# Patient Record
Sex: Male | Born: 1960 | Race: White | Hispanic: No | Marital: Married | Smoking: Never smoker
Health system: Southern US, Community
[De-identification: ages and names within clinical notes are randomized; demographics above are authoritative.]

## PROBLEM LIST (undated history)

## (undated) DIAGNOSIS — E7800 Pure hypercholesterolemia, unspecified: Secondary | ICD-10-CM

## (undated) DIAGNOSIS — R9431 Abnormal electrocardiogram [ECG] [EKG]: Secondary | ICD-10-CM

## (undated) DIAGNOSIS — K219 Gastro-esophageal reflux disease without esophagitis: Secondary | ICD-10-CM

## (undated) DIAGNOSIS — J329 Chronic sinusitis, unspecified: Secondary | ICD-10-CM

## (undated) HISTORY — DX: Chronic sinusitis, unspecified: J32.9

## (undated) HISTORY — DX: Abnormal electrocardiogram (ECG) (EKG): R94.31

## (undated) HISTORY — DX: Gastro-esophageal reflux disease without esophagitis: K21.9

## (undated) HISTORY — DX: Pure hypercholesterolemia, unspecified: E78.00

---
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
## 2005-07-27 ENCOUNTER — Ambulatory Visit: Payer: Self-pay | Admitting: Unknown Physician Specialty

## 2010-06-12 ENCOUNTER — Ambulatory Visit: Payer: Self-pay | Admitting: Unknown Physician Specialty

## 2010-06-14 LAB — PATHOLOGY REPORT

## 2012-05-13 ENCOUNTER — Ambulatory Visit (INDEPENDENT_AMBULATORY_CARE_PROVIDER_SITE_OTHER): Payer: BC Managed Care – PPO | Admitting: Internal Medicine

## 2012-05-13 ENCOUNTER — Encounter: Payer: Self-pay | Admitting: Internal Medicine

## 2012-05-13 VITALS — BP 116/80 | HR 84 | Temp 98.5°F | Ht 72.0 in | Wt 180.0 lb

## 2012-05-13 DIAGNOSIS — E7800 Pure hypercholesterolemia, unspecified: Secondary | ICD-10-CM

## 2012-05-13 DIAGNOSIS — R7989 Other specified abnormal findings of blood chemistry: Secondary | ICD-10-CM

## 2012-05-13 DIAGNOSIS — Z139 Encounter for screening, unspecified: Secondary | ICD-10-CM

## 2012-05-13 DIAGNOSIS — K219 Gastro-esophageal reflux disease without esophagitis: Secondary | ICD-10-CM | POA: Insufficient documentation

## 2012-05-13 DIAGNOSIS — E291 Testicular hypofunction: Secondary | ICD-10-CM

## 2012-05-13 DIAGNOSIS — R319 Hematuria, unspecified: Secondary | ICD-10-CM

## 2012-05-13 DIAGNOSIS — R5381 Other malaise: Secondary | ICD-10-CM

## 2012-05-13 DIAGNOSIS — R5383 Other fatigue: Secondary | ICD-10-CM

## 2012-05-13 MED ORDER — OMEPRAZOLE 40 MG PO CPDR: 40.0000 mg | DELAYED_RELEASE_CAPSULE | Freq: Every day | ORAL | Status: DC

## 2012-05-27 ENCOUNTER — Other Ambulatory Visit (INDEPENDENT_AMBULATORY_CARE_PROVIDER_SITE_OTHER): Payer: BC Managed Care – PPO

## 2012-05-27 DIAGNOSIS — R319 Hematuria, unspecified: Secondary | ICD-10-CM

## 2012-05-27 DIAGNOSIS — Z139 Encounter for screening, unspecified: Secondary | ICD-10-CM

## 2012-05-27 DIAGNOSIS — R5383 Other fatigue: Secondary | ICD-10-CM

## 2012-05-27 DIAGNOSIS — R5381 Other malaise: Secondary | ICD-10-CM

## 2012-05-27 LAB — CBC WITH DIFFERENTIAL/PLATELET
Basophils Absolute: 0 10*3/uL (ref 0.0–0.1)
Eosinophils Absolute: 0.2 10*3/uL (ref 0.0–0.7)
HCT: 42.1 % (ref 39.0–52.0)
Lymphs Abs: 2 10*3/uL (ref 0.7–4.0)
Monocytes Absolute: 0.6 10*3/uL (ref 0.1–1.0)
Monocytes Relative: 8.8 % (ref 3.0–12.0)
Platelets: 220 10*3/uL (ref 150.0–400.0)
RDW: 12.9 % (ref 11.5–14.6)

## 2012-05-27 LAB — COMPREHENSIVE METABOLIC PANEL
ALT: 16 U/L (ref 0–53)
AST: 14 U/L (ref 0–37)
Alkaline Phosphatase: 72 U/L (ref 39–117)
CO2: 31 mEq/L (ref 19–32)
Sodium: 138 mEq/L (ref 135–145)
Total Bilirubin: 0.7 mg/dL (ref 0.3–1.2)
Total Protein: 7.4 g/dL (ref 6.0–8.3)

## 2012-05-27 LAB — URINALYSIS, ROUTINE W REFLEX MICROSCOPIC
Bilirubin Urine: NEGATIVE
Leukocytes, UA: NEGATIVE
Nitrite: NEGATIVE
pH: 6 (ref 5.0–8.0)

## 2012-05-27 LAB — LIPID PANEL
HDL: 35 mg/dL — ABNORMAL LOW (ref >39.00)
Total CHOL/HDL Ratio: 5

## 2012-06-05 ENCOUNTER — Encounter: Payer: Self-pay | Admitting: Internal Medicine

## 2012-06-05 NOTE — Assessment & Plan Note (Signed)
Previously receiving testosterone injections.  Declines now secondary to financial reasons.

## 2012-06-05 NOTE — Progress Notes (Signed)
Subjective:    Patient ID: Howard Berry, male    DOB: [birth year], 52 y.o.   MRN: [id number]  HPI 52 year old male with past history of hypercholesterolemia who comes in today to follow up on this as well as for a complete physical exam.  States at Thanksgiving - had a cold.  This resolved.  Reports the first week of 12/13 he woke around 1:00 am and was sweating.  Felt a bear trap was around his chest.  Hurt to walk and move.  A few hours later, had an episode of emesis.  Pain eased some time after.  Took Tums and his omeprazole.  Also took Gaviscon.  Then noticed some pain in his back.  States felt better after he slept for a while, but reports for a few days - he had general body aches.  No pain like he initially experienced.  Has had no problems since.  Stays active.  Reports no chest pain or tightness with increased activity or exertion.  No sob.  Eating and drinking well.  No bowel change.    Past Medical History  Diagnosis Date  . Hypercholesterolemia   . Recurrent sinusitis   . GERD (gastroesophageal reflux disease)     EGD (2007) - erosive duodenitis, reflux esophagitis, multiple gastric polyps  required dilatation.  . Left axis deviation     noted on prior EKG    Current Outpatient Prescriptions on File Prior to Visit  Medication Sig Dispense Refill  . omeprazole (PRILOSEC) 40 MG capsule Take 1 capsule (40 mg total) by mouth daily.  90 capsule  1    Review of Systems Patient denies any headache, lightheadedness or dizziness.  No significant sinus or allergy symptoms now.  No chest pain, tightness or palpitations since the episode outlined above.  No increased shortness of breath, cough or congestion.  No nausea or vomiting.  No acid reflux.  No abdominal pain or cramping.  No bowel change, such as diarrhea, constipation, BRBPR or melana.  No urine change.  Some fatigue, but works a lot.  Overall he feels he is doing well.         Objective:   Physical Exam Filed Vitals:   05/13/12  0925  BP: 116/80  Pulse: 84  Temp: 98.5 F (53.76 C)   52 year old male in no acute distress.  HEENT:  Nares - clear.  Oropharynx - without lesions. NECK:  Supple.  Nontender.  No audible carotid bruit.  HEART:  Appears to be regular.   LUNGS:  No crackles or wheezing audible.  Respirations even and unlabored.   RADIAL PULSE:  Equal bilaterally.  ABDOMEN:  Soft.  Nontender.  Bowel sounds present and normal.  No audible abdominal bruit.  GU:  Normal descended testicles.  No palpable testicular nodules.   RECTAL:  Could not appreciate any palpable prostate nodules.  Heme negative.   EXTREMITIES:  No increased edema present.  DP pulses palpable and equal bilaterally.          Assessment & Plan:  CHEST PAIN.  Unclear as to the exact etiology.  Had a recent stress test - negative for ischemia.  Is active and reports no symptoms with increased activity or exertion.  Declines EKG or further w/up.  Discussed the possibility of a GI origin (ie, gallbladder, etc).  Discussed my desire for further testing.  He declines.  Wants to monitor.  Informed if he has any reoccurrence or problems, he is to be evaluated  immediately.    PREVIOUS LEFT ANTERIOR CERVICAL MASS.  Saw ENT.  Recommended no further w/up.  FATIGUE.  Check cbc, met c and tsh.   HEALTH MAINTENANCE.  Physical today.  Check psa.  Obtain records for review regarding GI evaluation.

## 2012-06-05 NOTE — Assessment & Plan Note (Signed)
Saw Dr Daniel.  Had cystoscopy 10/11 - revealed an irregular area in the bladder.  See previous note.  Have recommended follow up.  He has declined.  Recheck urinalysis with next labs.    

## 2012-06-05 NOTE — Assessment & Plan Note (Signed)
Low cholesterol diet.  Check lipid panel.    

## 2012-06-05 NOTE — Assessment & Plan Note (Signed)
Symptoms controlled on prilosec.  Follow.   

## 2012-09-22 ENCOUNTER — Telehealth: Payer: Self-pay | Admitting: *Deleted

## 2012-09-22 NOTE — Telephone Encounter (Signed)
Started 2 wks ago when driving home from work, starting having pain below rib cage- threw up twice. Took 2 days for pain to go away. Wednesday of last week he became nauseated & sick on the stomach. Went to supper that evening & vomitted. Happened again on Thursday (N&V). Went to [facility] Friday night, did fine until Saturday morning when he threw up again after eating breakfast and drinking Gatorade. Saturday he did fine. Sunday he did fine until that night, pain below rib cage started again. Not sleeping well. Robin offfered appt with Raquel today at 11:30 & with Dr Lorin Picket tomorrow @ 2:45. Please advise (pt in lobby waiting)

## 2012-09-22 NOTE — Telephone Encounter (Signed)
Pt went to Urgent Care & will call back to let us know where he went so we can request records & f/u

## 2012-09-22 NOTE — Telephone Encounter (Signed)
Agree with pt being seen today.  I was unable to see before 2:45.

## 2012-09-26 ENCOUNTER — Ambulatory Visit: Payer: Self-pay | Admitting: Family Medicine

## 2012-09-26 ENCOUNTER — Inpatient Hospital Stay: Payer: Self-pay | Admitting: Surgery

## 2012-09-26 LAB — COMPREHENSIVE METABOLIC PANEL
Albumin: 3.8 g/dL (ref 3.4–5.0)
Alkaline Phosphatase: 79 U/L (ref 50–136)
BUN: 11 mg/dL (ref 7–18)
Bilirubin,Total: 0.6 mg/dL (ref 0.2–1.0)
Calcium, Total: 9.2 mg/dL (ref 8.5–10.1)
Chloride: 103 mmol/L (ref 98–107)
Co2: 29 mmol/L (ref 21–32)
EGFR (African American): >60
Glucose: 79 mg/dL (ref 65–99)
Osmolality: 274 (ref 275–301)
Potassium: 3.8 mmol/L (ref 3.5–5.1)
SGOT(AST): 19 U/L (ref 15–37)
SGPT (ALT): 18 U/L (ref 12–78)
Sodium: 138 mmol/L (ref 136–145)
Total Protein: 8.2 g/dL (ref 6.4–8.2)

## 2012-09-26 LAB — CBC WITH DIFFERENTIAL/PLATELET
Basophil #: 0 10*3/uL (ref 0.0–0.1)
Basophil %: 0.3 %
Eosinophil #: 0.2 10*3/uL (ref 0.0–0.7)
Eosinophil %: 2.4 %
HCT: 39.6 % — ABNORMAL LOW (ref 40.0–52.0)
HGB: 13.7 g/dL (ref 13.0–18.0)
MCH: 30.8 pg (ref 26.0–34.0)
MCV: 89 fL (ref 80–100)
Monocyte #: 0.6 x10 3/mm (ref 0.2–1.0)
Monocyte %: 7.7 %
Neutrophil #: 4.9 10*3/uL (ref 1.4–6.5)
Neutrophil %: 67.8 %
Platelet: 245 10*3/uL (ref 150–440)
WBC: 7.3 10*3/uL (ref 3.8–10.6)

## 2012-10-01 LAB — PATHOLOGY REPORT

## 2012-10-02 HISTORY — PX: CHOLECYSTECTOMY: SHX55

## 2012-11-05 ENCOUNTER — Other Ambulatory Visit: Payer: Self-pay | Admitting: *Deleted

## 2012-11-05 MED ORDER — OMEPRAZOLE 40 MG PO CPDR: 40.0000 mg | DELAYED_RELEASE_CAPSULE | Freq: Every day | ORAL | Status: DC

## 2013-02-23 ENCOUNTER — Encounter: Payer: Self-pay | Admitting: Internal Medicine

## 2013-02-23 ENCOUNTER — Ambulatory Visit (INDEPENDENT_AMBULATORY_CARE_PROVIDER_SITE_OTHER): Payer: BC Managed Care – PPO | Admitting: Internal Medicine

## 2013-02-23 VITALS — BP 100/70 | HR 84 | Temp 98.8°F | Ht 72.0 in | Wt 171.5 lb

## 2013-02-23 DIAGNOSIS — J329 Chronic sinusitis, unspecified: Secondary | ICD-10-CM

## 2013-02-23 MED ORDER — ALBUTEROL SULFATE HFA 108 (90 BASE) MCG/ACT IN AERS: 2.0000 | INHALATION_SPRAY | Freq: Four times a day (QID) | RESPIRATORY_TRACT | Status: DC | PRN

## 2013-02-23 MED ORDER — LEVOFLOXACIN 500 MG PO TABS: 500.0000 mg | ORAL_TABLET | Freq: Every day | ORAL | Status: DC

## 2013-02-23 NOTE — Patient Instructions (Signed)
Saline nasal spray - flush nose at least 2-3x/day.  Nasonex - two sprays each nostril one time per day (do in the evening).   mucinex in the am and Robitussin in the evening.  levaquin - one per day.  I am going to give you an inhaler if needed.

## 2013-02-24 ENCOUNTER — Ambulatory Visit: Payer: BC Managed Care – PPO | Admitting: Adult Health

## 2013-02-28 ENCOUNTER — Encounter: Payer: Self-pay | Admitting: Internal Medicine

## 2013-02-28 DIAGNOSIS — J329 Chronic sinusitis, unspecified: Secondary | ICD-10-CM | POA: Insufficient documentation

## 2013-02-28 NOTE — Progress Notes (Signed)
  Subjective:    Patient ID: Howard Berry, male    DOB: 1961-03-05, 52 y.o.   MRN: [id number]  Wheezing   51 year old male with past history of hypercholesterolemia who comes in today as a work in with concerns regarding increased congestion and cough.  States that symptoms started at the end of 9/14.  Felt better after he went to the beach.  On 02/05/13 he returned from the beach and symptoms returned.  Started having a sinus headache.  Subsequently developed wheezing.  Some increased chest congestion.  Productive cough - thick white and green mucus.  Intermittent drainage.  No sob.  Eating and drinking well.  No bowel change.  Intermittent subjective fever.     Past Medical History  Diagnosis Date  . Hypercholesterolemia   . Recurrent sinusitis   . GERD (gastroesophageal reflux disease)     EGD (2007) - erosive duodenitis, reflux esophagitis, multiple gastric polyps  required dilatation.  . Left axis deviation     noted on prior EKG    Current Outpatient Prescriptions on File Prior to Visit  Medication Sig Dispense Refill  . omeprazole (PRILOSEC) 40 MG capsule Take 1 capsule (40 mg total) by mouth daily.  90 capsule  1   No current facility-administered medications on file prior to visit.    Review of Systems  Respiratory: Positive for wheezing.   Patient denies any lightheadedness or dizziness.  Increased sinus pressure and congestion.  No chest pain, tightness or palpitations.   No increased shortness of breath.  Does report the chest congestion and productive cough of colored mucus.   No nausea or vomiting.  No acid reflux.  No abdominal pain or cramping.  No bowel change, such as diarrhea, constipation, BRBPR or melana.  No urine change.          Objective:   Physical Exam  Filed Vitals:   02/23/13 1109  BP: 100/70  Pulse: 84  Temp: 98.8 F (40.86 C)   52 year old male in no acute distress.  HEENT:  Nares - slightly erythematous turbinates.   Oropharynx - without  lesions. NECK:  Supple.  Nontender.  No audible carotid bruit.  HEART:  Appears to be regular.   LUNGS:  No crackles or wheezing audible.  Respirations even and unlabored.  Some increased cough with forced expiration.   RADIAL PULSE:  Equal bilaterally.           Assessment & Plan:  HEALTH MAINTENANCE.  Physical last visit.

## 2013-02-28 NOTE — Assessment & Plan Note (Signed)
Sinusitis and URI with associated cough and congestion.  Intermittent wheezing.  Will treat with levaquin and albuterol as directed.  Rest.  Fluids.  Explained to him if symptoms changed, worsened or did not resolve - he was to be reevaluated.

## 2013-04-06 ENCOUNTER — Telehealth: Payer: Self-pay | Admitting: Internal Medicine

## 2013-04-06 NOTE — Telephone Encounter (Signed)
Noted  

## 2013-04-06 NOTE — Telephone Encounter (Signed)
Please advise 

## 2013-04-06 NOTE — Telephone Encounter (Signed)
Patient Information:  Caller Name: Johnluke  Phone: [phone]  Patient: Howard Berry, Howard Berry  Gender: Male  DOB: [birth year]  Age: 52 Years  PCP: Dale Duplin  Office Follow Up:  Does the office need to follow up with this patient?: Yes  Instructions For The Office: Patient asking for rx vs office visit but will come in if needed.  Please f/u with patient for preference.   Symptoms  Reason For Call & Symptoms: Seen 3 wks ago, abx completed and seemed better;  Symptoms resumed again 04/04/13, runny nose, watery eyes, cough, wheezing;  Sore throat onset 04/06/13;  Felt like he may have had fever during the night but none this am.  Reviewed Health History In EMR: Yes  Reviewed Medications In EMR: Yes  Reviewed Allergies In EMR: Yes  Reviewed Surgeries / Procedures: Yes  Date of Onset of Symptoms: 04/04/2013  Treatments Tried: Allegra D  Treatments Tried Worked: No  Guideline(s) Used:  Cough  Disposition Per Guideline:   See Within 3 Days in Office  Reason For Disposition Reached:   Allergy symptoms are also present (e.g., itchy eyes, clear nasal discharge, postnasal drip)  Advice Given:  Reassurance  Coughing is the [place] that our lungs remove irritants and mucus. It helps protect our lungs from getting pneumonia.  Here is some care advice that should help.  Cough Medicines:  OTC Cough Drops: Cough drops can help a lot, especially for mild coughs. They reduce coughing by soothing your irritated throat and removing that tickle sensation in the back of the throat. Cough drops also have the advantage of portability - you can carry them with you.  Home Remedy - Hard Candy: Hard candy works just as well as medicine-flavored OTC cough drops. Diabetics should use sugar-free candy.  Home Remedy - Honey: This old home remedy has been shown to help decrease coughing at night. The adult dosage is 2 teaspoons (10 ml) at bedtime. Honey should not be given to infants under one year of age.  OTC Cough  Syrup - Dextromethorphan:  Cough syrups containing the cough suppressant dextromethorphan (DM) may help decrease your cough. Cough syrups work best for coughs that keep you awake at night. They can also sometimes help in the late stages of a respiratory infection when the cough is dry and hacking. They can be used along with cough drops.  Examples: Benylin, Robitussin DM, Vicks 44 Cough Relief  Coughing Spasms:  Drink warm fluids. Inhale warm mist (Reason: both relax the airway and loosen up the phlegm).  Suck on cough drops or hard candy to coat the irritated throat.  Prevent Dehydration:  Drink adequate liquids.  Fever Medicines:  For fevers above 101 F (38.3 C) take either acetaminophen or ibuprofen.  They are over-the-counter (OTC) drugs that help treat both fever and pain. You can buy them at the drugstore.l  Fever Medicines:  For fevers above 101 F (38.3 C) take either acetaminophen or ibuprofen.  They are over-the-counter (OTC) drugs that help treat both fever and pain. You can buy them at the drugstore.l  Acetaminophen (e.g., Tylenol):  Regular Strength Tylenol: Take 650 mg (two 325 mg pills) by mouth every 4-6 hours as needed. Each Regular Strength Tylenol pill has 325 mg of acetaminophen.  Ibuprofen (e.g., Motrin, Advil):  Take 400 mg (two 200 mg pills) by mouth every 6 hours.  Another choice is to take 600 mg (three 200 mg pills) by mouth every 8 hours.  Call Back If:  Difficulty breathing  You become worse.  For a Stuffy Nose - Use Nasal Washes:  Introduction: Saline (salt water) nasal irrigation (nasal wash) is an effective and simple home remedy for treating stuffy nose and sinus congestion. The nose can be irrigated by pouring, spraying, or squirting salt water into the nose and then letting it run back out.  How it Helps: The salt water rinses out excess mucus, washes out any irritants (dust, allergens) that might be present, and moistens the nasal cavity.  Methods: There  are several ways to perform nasal irrigation. You can use a saline nasal spray bottle (available over-the-counter), a rubber ear syringe, a medical syringe without the needle, or a Neti Pot.  Call Back If:  Difficulty breathing occurs  You become worse  Patient Refused Recommendation:  Patient Requests Prescription  Patient will come in if needed, asking if something can be called in though instead?

## 2013-04-06 NOTE — Telephone Encounter (Signed)
I do agree he needs to be seen.  I can see him this pm as a work in.  May have to wait since being worked in.

## 2013-04-06 NOTE — Telephone Encounter (Signed)
Spoke with patient & he is at work now. Scheduled him for an appt with Raquel tomorrow morning

## 2013-04-07 ENCOUNTER — Encounter: Payer: Self-pay | Admitting: Adult Health

## 2013-04-07 ENCOUNTER — Ambulatory Visit (INDEPENDENT_AMBULATORY_CARE_PROVIDER_SITE_OTHER): Payer: BC Managed Care – PPO | Admitting: Adult Health

## 2013-04-07 VITALS — BP 122/80 | HR 99 | Temp 98.6°F | Wt 171.0 lb

## 2013-04-07 DIAGNOSIS — J329 Chronic sinusitis, unspecified: Secondary | ICD-10-CM

## 2013-04-07 MED ORDER — LEVOFLOXACIN 500 MG PO TABS: 500.0000 mg | ORAL_TABLET | Freq: Every day | ORAL | Status: DC

## 2013-04-07 MED ORDER — GUAIFENESIN-CODEINE 100-10 MG/5ML PO SOLN: 5.0000 mL | Freq: Three times a day (TID) | ORAL | Status: DC | PRN

## 2013-04-07 NOTE — Progress Notes (Signed)
   Subjective:    Patient ID: Howard Berry, male    DOB: [birth year], 52 y.o.   MRN: [id number]  HPI  Patient presents with cough, congestion in chest, wheezing. Also with sore throat. Reports fever ~ 101. Patient reports that his mother was recently in the hospital. Most of the family have gotten sick. His symptoms began over the weekend. He has been taking tylenol and mucinex DM.  Current Outpatient Prescriptions on File Prior to Visit  Medication Sig Dispense Refill  . omeprazole (PRILOSEC) 40 MG capsule Take 1 capsule (40 mg total) by mouth daily.  90 capsule  1  . albuterol (PROVENTIL HFA;VENTOLIN HFA) 108 (90 BASE) MCG/ACT inhaler Inhale 2 puffs into the lungs every 6 (six) hours as needed for wheezing.  1 Inhaler  0   No current facility-administered medications on file prior to visit.    Review of Systems  Constitutional: Positive for fever.  HENT: Positive for congestion, postnasal drip, rhinorrhea, sinus pressure and sore throat.   Respiratory: Positive for cough and wheezing.        Objective:   Physical Exam  Constitutional: He is oriented to person, place, and time. He appears well-developed and well-nourished.  HENT:  Head: Normocephalic and atraumatic.  Pharyngeal erythema  Cardiovascular: Normal rate, regular rhythm and normal heart sounds.  Exam reveals no gallop.   No murmur heard. Pulmonary/Chest: Effort normal and breath sounds normal. No respiratory distress. He has no wheezes. He has no rales.  Lymphadenopathy:    He has no cervical adenopathy.  Neurological: He is alert and oriented to person, place, and time.  Skin: Skin is warm and dry.  Psychiatric: He has a normal mood and affect. His behavior is normal. Judgment and thought content normal.          Assessment & Plan:

## 2013-04-07 NOTE — Assessment & Plan Note (Signed)
Start Levaquin 500 mg daily x10 days. Robitussin-AC for severe cough. RTC if symptoms are not improved within 3-4 days.

## 2013-04-07 NOTE — Patient Instructions (Signed)
  Start levaquin 500 mg daily for 10 days.  Robitussin AC for severe cough. This has codeine so it will cause sedation.

## 2013-04-07 NOTE — Progress Notes (Signed)
Pre visit review using our clinic review tool, if applicable. No additional management support is needed unless otherwise documented below in the visit note. 

## 2013-06-03 ENCOUNTER — Other Ambulatory Visit: Payer: Self-pay | Admitting: *Deleted

## 2013-06-03 ENCOUNTER — Encounter: Payer: Self-pay | Admitting: Adult Health

## 2013-06-03 ENCOUNTER — Encounter (INDEPENDENT_AMBULATORY_CARE_PROVIDER_SITE_OTHER): Payer: Self-pay

## 2013-06-03 ENCOUNTER — Ambulatory Visit (INDEPENDENT_AMBULATORY_CARE_PROVIDER_SITE_OTHER): Payer: BC Managed Care – PPO | Admitting: Adult Health

## 2013-06-03 VITALS — BP 106/68 | HR 74 | Temp 98.2°F | Resp 12 | Wt 173.0 lb

## 2013-06-03 DIAGNOSIS — J329 Chronic sinusitis, unspecified: Secondary | ICD-10-CM

## 2013-06-03 MED ORDER — PREDNISONE 10 MG PO TABS: ORAL_TABLET | ORAL | Status: DC

## 2013-06-03 MED ORDER — OMEPRAZOLE 40 MG PO CPDR: 40.0000 mg | DELAYED_RELEASE_CAPSULE | Freq: Every day | ORAL | Status: DC

## 2013-06-03 NOTE — Progress Notes (Signed)
Pre visit review using our clinic review tool, if applicable. No additional management support is needed unless otherwise documented below in the visit note. 

## 2013-06-03 NOTE — Progress Notes (Signed)
   Subjective:    Patient ID: Howard Berry, male    DOB: 01/11/1961, 53 y.o.   MRN: [id number]  HPI  Pt is a 53 y/o male who presents to clinic with sinus issues and lingering cough. Was seen in clinic in December and started on antibiotic as well as cough medication. He reports blowing his nose and seeing considerable amount of blood. He is also reporting that he will experience wheezing with activity. None at present. He has an inhaler however she has not been using it. He also has Flonase nasal spray as well as Nasonex but he has not been using these either. No fever or chills.    Current Outpatient Prescriptions on File Prior to Visit  Medication Sig Dispense Refill  . albuterol (PROVENTIL HFA;VENTOLIN HFA) 108 (90 BASE) MCG/ACT inhaler Inhale 2 puffs into the lungs every 6 (six) hours as needed for wheezing.  1 Inhaler  0  . omeprazole (PRILOSEC) 40 MG capsule Take 1 capsule (40 mg total) by mouth daily.  90 capsule  1   No current facility-administered medications on file prior to visit.     Review of Systems  Constitutional: Negative for fever and chills.  HENT: Positive for sinus pressure.        Has seen blood when blowing his nose. None in the past few days. Some congestion of sinuses.  Respiratory: Positive for cough and wheezing.   All other systems reviewed and are negative.       Objective:   Physical Exam  Constitutional: He is oriented to person, place, and time. He appears well-developed and well-nourished. No distress.  HENT:  Head: Normocephalic and atraumatic.  Mild erythema of pharynx  Pulmonary/Chest: Effort normal and breath sounds normal. No respiratory distress. He has no wheezes. He has no rales.  Musculoskeletal: Normal range of motion.  Lymphadenopathy:    He has no cervical adenopathy.  Neurological: He is alert and oriented to person, place, and time.  Psychiatric: He has a normal mood and affect. His behavior is normal. Judgment and thought content  normal.    BP 106/68  Pulse 74  Temp(Src) 98.2 F (36.8 C) (Oral)  Resp 12  Wt 173 lb (78.472 kg)  SpO2 99%       Assessment & Plan:

## 2013-06-03 NOTE — Patient Instructions (Signed)
  Start the flonase 2 sprays into each nostril daily for your sinuses.  Start prednisone taper as follows:  Day #1 - take 6 tablets Day #2 - take 5 tablets Day #3 - take 4 tablets Day #4 - take 3 tablets Day #5 - take 2 tablets Day #6 - take 1 tablet

## 2013-06-03 NOTE — Assessment & Plan Note (Signed)
Patient is still having some sinus symptoms. No need for antibiotic at present. Will start on prednisone taper. Also instructed to use Flonase 2 sprays into each nostril once a day. Reminded patient to use inhaler with wheezing or shortness of breath. RTC if symptoms are not improved within 3-4 days.

## 2013-07-30 ENCOUNTER — Encounter: Payer: Self-pay | Admitting: Adult Health

## 2013-07-30 ENCOUNTER — Telehealth: Payer: Self-pay | Admitting: Internal Medicine

## 2013-07-30 ENCOUNTER — Ambulatory Visit (INDEPENDENT_AMBULATORY_CARE_PROVIDER_SITE_OTHER): Payer: BC Managed Care – PPO | Admitting: Adult Health

## 2013-07-30 VITALS — BP 104/84 | HR 78 | Temp 98.4°F | Resp 14 | Wt 174.0 lb

## 2013-07-30 DIAGNOSIS — R519 Headache, unspecified: Secondary | ICD-10-CM | POA: Insufficient documentation

## 2013-07-30 DIAGNOSIS — R42 Dizziness and giddiness: Secondary | ICD-10-CM

## 2013-07-30 DIAGNOSIS — R0789 Other chest pain: Secondary | ICD-10-CM | POA: Insufficient documentation

## 2013-07-30 DIAGNOSIS — IMO0001 Reserved for inherently not codable concepts without codable children: Secondary | ICD-10-CM

## 2013-07-30 DIAGNOSIS — R51 Headache: Secondary | ICD-10-CM

## 2013-07-30 DIAGNOSIS — M791 Myalgia, unspecified site: Secondary | ICD-10-CM

## 2013-07-30 LAB — COMPREHENSIVE METABOLIC PANEL
ALT: 18 U/L (ref 0–53)
AST: 17 U/L (ref 0–37)
Albumin: 4.8 g/dL (ref 3.5–5.2)
Alkaline Phosphatase: 76 U/L (ref 39–117)
BUN: 10 mg/dL (ref 6–23)
CALCIUM: 9.6 mg/dL (ref 8.4–10.5)
CHLORIDE: 101 meq/L (ref 96–112)
CO2: 30 mEq/L (ref 19–32)
CREATININE: 1 mg/dL (ref 0.4–1.5)
GFR: 86.96 mL/min (ref >60.00)
Glucose, Bld: 87 mg/dL (ref 70–99)
Potassium: 4.8 mEq/L (ref 3.5–5.1)
Sodium: 138 mEq/L (ref 135–145)
Total Bilirubin: 0.9 mg/dL (ref 0.3–1.2)
Total Protein: 7.7 g/dL (ref 6.0–8.3)

## 2013-07-30 LAB — CBC WITH DIFFERENTIAL/PLATELET
BASOS ABS: 0 10*3/uL (ref 0.0–0.1)
BASOS PCT: 0.7 % (ref 0.0–3.0)
EOS ABS: 0.2 10*3/uL (ref 0.0–0.7)
Eosinophils Relative: 2.3 % (ref 0.0–5.0)
HCT: 43.3 % (ref 39.0–52.0)
HEMOGLOBIN: 14.8 g/dL (ref 13.0–17.0)
LYMPHS ABS: 2 10*3/uL (ref 0.7–4.0)
Lymphocytes Relative: 25.7 % (ref 12.0–46.0)
MCHC: 34 g/dL (ref 30.0–36.0)
MCV: 90.5 fl (ref 78.0–100.0)
Monocytes Absolute: 0.6 10*3/uL (ref 0.1–1.0)
Monocytes Relative: 8.5 % (ref 3.0–12.0)
NEUTROS ABS: 4.8 10*3/uL (ref 1.4–7.7)
Neutrophils Relative %: 62.8 % (ref 43.0–77.0)
Platelets: 238 10*3/uL (ref 150.0–400.0)
RBC: 4.79 Mil/uL (ref 4.22–5.81)
RDW: 12.9 % (ref 11.5–14.6)
WBC: 7.6 10*3/uL (ref 4.5–10.5)

## 2013-07-30 NOTE — Telephone Encounter (Signed)
Put pt on racquel schedule 3/26 11:15

## 2013-07-30 NOTE — Progress Notes (Signed)
Pre visit review using our clinic review tool, if applicable. No additional management support is needed unless otherwise documented below in the visit note. 

## 2013-07-30 NOTE — Telephone Encounter (Signed)
Pt came in today wanting dr scott to know bp jumping around and has slight headache. Had pt fill out traige form and gave to [place] message to talk to pt

## 2013-07-30 NOTE — Progress Notes (Signed)
Patient ID: Howard Berry, male   DOB: July 05, 1960, 53 y.o.   MRN: [id number]    Subjective:    Patient ID: Howard Berry, male    DOB: 1961/01/16, 53 y.o.   MRN: [id number]  HPI  Pt presents to clinic with HA x 2-3 days. He is here to evaluate his b/p as he thinks it may be related. He reports checking his blood pressure at home and it was fine. Does not know the reading. His wife checked it. HA is "faint, comes and goes". He reports that last week he was working outside in the yard and felt lightheaded. Episode was brief. No syncope. He was putting fertilizer out. He was also spraying weeds. He has been lifting heavy bags, racking. When he felt lightheaded he reports it was before lunch time and he does not eat much at breakfast. He does not drink much fluid. He also reports pinpoint area on left side of upper chest that aches with movement of his left arm. Denies pain at rest. Denies sob, jaw pain or arm pain. Patient has not taken any medication for any of his symptoms.   Past Medical History  Diagnosis Date  . Hypercholesterolemia   . Recurrent sinusitis   . GERD (gastroesophageal reflux disease)     EGD (2007) - erosive duodenitis, reflux esophagitis, multiple gastric polyps  required dilatation.  . Left axis deviation     noted on prior EKG    Current Outpatient Prescriptions on File Prior to Visit  Medication Sig Dispense Refill  . albuterol (PROVENTIL HFA;VENTOLIN HFA) 108 (90 BASE) MCG/ACT inhaler Inhale 2 puffs into the lungs every 6 (six) hours as needed for wheezing.  1 Inhaler  0  . omeprazole (PRILOSEC) 40 MG capsule Take 1 capsule (40 mg total) by mouth daily.  90 capsule  3   No current facility-administered medications on file prior to visit.     Review of Systems  Constitutional: Negative for fatigue.  Respiratory: Negative.   Cardiovascular: Negative.   Musculoskeletal:       Muscular pain left side of chest  Allergic/Immunologic: Negative.   Neurological: Positive for  light-headedness and headaches.  Psychiatric/Behavioral: Negative.   All other systems reviewed and are negative.       Objective:  BP 104/84  Pulse 78  Temp(Src) 98.4 F (36.9 C) (Oral)  Resp 14  Wt 174 lb (78.926 kg)  SpO2 95%   Physical Exam  Constitutional: He is oriented to person, place, and time. He appears well-developed and well-nourished. No distress.  HENT:  Head: Normocephalic and atraumatic.  Eyes: Conjunctivae and EOM are normal. Pupils are equal, round, and reactive to light.  Cardiovascular: Normal rate, regular rhythm, normal heart sounds and intact distal pulses.  Exam reveals no gallop and no friction rub.   No murmur heard. Pulmonary/Chest: Effort normal and breath sounds normal. No respiratory distress. He has no wheezes. He has no rales.  Musculoskeletal: Normal range of motion.  Neurological: He is alert and oriented to person, place, and time.  Skin: Skin is warm and dry.  Psychiatric: He has a normal mood and affect. His behavior is normal. Judgment and thought content normal.      Assessment & Plan:   1. Headache(784.0) Mild headache - comes and goes. Blood pressure is not elevated. He has not taken any medication. Recommend tylenol or advil for HA. Call if symptoms persist or intensify.  2. Lightheaded Suspect multifactorial - hours without any oral intake, dehydrated, b/p  low. Transient. Denies occuring again. I will check electrolytes to see if any abnormality. Will also check cbc   - Comprehensive metabolic panel - CBC with Differential  3. Muscular pain Pinpoint area on left side of upper chest that is tender with movement. Given his reports of lifting heavy bags and working outside in the yard, suspect muscular in nature. ? Costochondritis. There is no discomfort at rest. No pain with activity other than involving expansion of chest wall. Recommend advil for inflammation and pain. This will also help his HA. If no improvement he will need to  be evaluated further.

## 2013-07-31 ENCOUNTER — Telehealth: Payer: Self-pay | Admitting: Internal Medicine

## 2013-07-31 NOTE — Telephone Encounter (Signed)
Relevant patient education assigned to patient using Emmi. ° °

## 2013-09-16 ENCOUNTER — Encounter: Payer: Self-pay | Admitting: Internal Medicine

## 2013-09-16 ENCOUNTER — Ambulatory Visit (INDEPENDENT_AMBULATORY_CARE_PROVIDER_SITE_OTHER): Payer: BC Managed Care – PPO | Admitting: Internal Medicine

## 2013-09-16 VITALS — BP 120/80 | HR 73 | Temp 98.5°F | Ht 72.0 in | Wt 172.8 lb

## 2013-09-16 DIAGNOSIS — E291 Testicular hypofunction: Secondary | ICD-10-CM

## 2013-09-16 DIAGNOSIS — R7989 Other specified abnormal findings of blood chemistry: Secondary | ICD-10-CM

## 2013-09-16 DIAGNOSIS — R51 Headache: Secondary | ICD-10-CM

## 2013-09-16 DIAGNOSIS — R0781 Pleurodynia: Secondary | ICD-10-CM

## 2013-09-16 DIAGNOSIS — R42 Dizziness and giddiness: Secondary | ICD-10-CM

## 2013-09-16 DIAGNOSIS — R079 Chest pain, unspecified: Secondary | ICD-10-CM

## 2013-09-16 DIAGNOSIS — K219 Gastro-esophageal reflux disease without esophagitis: Secondary | ICD-10-CM

## 2013-09-16 DIAGNOSIS — R319 Hematuria, unspecified: Secondary | ICD-10-CM

## 2013-09-16 DIAGNOSIS — E7800 Pure hypercholesterolemia, unspecified: Secondary | ICD-10-CM

## 2013-09-16 NOTE — Progress Notes (Signed)
Pre visit review using our clinic review tool, if applicable. No additional management support is needed unless otherwise documented below in the visit note. 

## 2013-09-22 ENCOUNTER — Other Ambulatory Visit: Payer: Self-pay | Admitting: Internal Medicine

## 2013-09-22 ENCOUNTER — Encounter: Payer: Self-pay | Admitting: Internal Medicine

## 2013-09-22 ENCOUNTER — Other Ambulatory Visit (INDEPENDENT_AMBULATORY_CARE_PROVIDER_SITE_OTHER): Payer: BC Managed Care – PPO

## 2013-09-22 ENCOUNTER — Ambulatory Visit (INDEPENDENT_AMBULATORY_CARE_PROVIDER_SITE_OTHER)
Admission: RE | Admit: 2013-09-22 | Discharge: 2013-09-22 | Disposition: A | Discharge status: Home / Self Care | Payer: BC Managed Care – PPO | Source: Ambulatory Visit | Attending: Internal Medicine | Admitting: Internal Medicine

## 2013-09-22 DIAGNOSIS — E7800 Pure hypercholesterolemia, unspecified: Secondary | ICD-10-CM

## 2013-09-22 DIAGNOSIS — R079 Chest pain, unspecified: Secondary | ICD-10-CM

## 2013-09-22 DIAGNOSIS — R0781 Pleurodynia: Secondary | ICD-10-CM

## 2013-09-22 LAB — LIPID PANEL
CHOL/HDL RATIO: 5
Cholesterol: 183 mg/dL (ref 0–200)
HDL: 33.6 mg/dL — AB (ref >39.00)
LDL Cholesterol: 89 mg/dL (ref 0–99)
Triglycerides: 301 mg/dL — ABNORMAL HIGH (ref 0.0–149.0)
VLDL: 60.2 mg/dL — ABNORMAL HIGH (ref 0.0–40.0)

## 2013-09-22 LAB — TSH: TSH: 0.53 u[IU]/mL (ref 0.35–4.50)

## 2013-09-22 NOTE — Assessment & Plan Note (Signed)
Low cholesterol diet.  Check lipid panel.    

## 2013-09-22 NOTE — Assessment & Plan Note (Signed)
Saw Dr Quillian Quince.  Had cystoscopy 10/11 - revealed an irregular area in the bladder.  See previous note.  Have recommended follow up.  He has declined.  Recheck urinalysis with next labs.

## 2013-09-22 NOTE — Progress Notes (Signed)
Subjective:    Patient ID: Howard Berry, male    DOB: 1960/06/12, 53 y.o.   MRN: [id number]  HPI 53 year old male with past history of hypercholesterolemia who comes in today to follow up on this as well as for a complete physical exam.   Was recently evaluated by Raquel Rey.  See her note for details.  Headache better.  Energy better.  Still has some localized chest soreness.  Tender to palpation.  No cough or congestion.  Eating and drinking normally.  No acid reflux.  No swallowing difficulty.  Stays active.  Reports no chest pain or tightness with increased activity or exertion.  No sob.  No bowel change.  No known injury or trauma.      Past Medical History  Diagnosis Date  . Hypercholesterolemia   . Recurrent sinusitis   . GERD (gastroesophageal reflux disease)     EGD (2007) - erosive duodenitis, reflux esophagitis, multiple gastric polyps  required dilatation.  . Left axis deviation     noted on prior EKG    Current Outpatient Prescriptions on File Prior to Visit  Medication Sig Dispense Refill  . albuterol (PROVENTIL HFA;VENTOLIN HFA) 108 (90 BASE) MCG/ACT inhaler Inhale 2 puffs into the lungs every 6 (six) hours as needed for wheezing.  1 Inhaler  0  . omeprazole (PRILOSEC) 40 MG capsule Take 1 capsule (40 mg total) by mouth daily.  90 capsule  3   No current facility-administered medications on file prior to visit.    Review of Systems Patient denies any headache, lightheadedness or dizziness.  No significant sinus or allergy symptoms now.  Chest pain to palpation as outlined.  No palpitations.  No chest tightness with increased activity or exertion.  No increased shortness of breath, cough or congestion.  No pain with deep breathing.  No nausea or vomiting.  No acid reflux.  No abdominal pain or cramping.  No bowel change, such as diarrhea, constipation, BRBPR or melana.  No urine change.  Overall he feels he is doing relatively well.  Just is concerned that his chest is still  sore.  Stays very active.  No chest pain or tightness with increased activity or exertion.         Objective:   Physical Exam  Filed Vitals:   09/16/13 1023  BP: 120/80  Pulse: 73  Temp: 98.5 F (36.9 C)   Blood pressure recheck:  112/78 (left arm) and 116/80 (right arm).   53 year old male in no acute distress.  HEENT:  Nares - clear.  Oropharynx - without lesions. NECK:  Supple.  Nontender.  No audible carotid bruit.  HEART:  Appears to be regular.   LUNGS:  No crackles or wheezing audible.  Respirations even and unlabored.   CHEST WALL:  Increased tenderness to palpation over the left anterior rib.   RADIAL PULSE:  Equal bilaterally.  ABDOMEN:  Soft.  Nontender.  Bowel sounds present and normal.  No audible abdominal bruit.  GU:  Normal descended testicles.  No palpable testicular nodules.   RECTAL:  Could not appreciate any palpable prostate nodules.  Heme negative.   EXTREMITIES:  No increased edema present.  DP pulses palpable and equal bilaterally.          Assessment & Plan:  CHEST PAIN.  Unclear as to the exact etiology.  Had a recent stress test - negative for ischemia.  Is active and reports no symptoms with increased activity or exertion.  Declines  EKG.  Pain is reproducible on exam.  Appears to be msk in origin.  Will check cxr and left anterior rib xray.  Further w/up pending results.     PREVIOUS LEFT ANTERIOR CERVICAL MASS.  Saw ENT.  Recommended no further w/up.  HEALTH MAINTENANCE.  Physical today.  Check psa.    I spent 25 minutes with the patient and more than 50% of the time was spent in consultation regarding the above.

## 2013-09-22 NOTE — Progress Notes (Signed)
Orders placed for f/u labs.  

## 2013-09-22 NOTE — Assessment & Plan Note (Signed)
Resolved

## 2013-09-22 NOTE — Assessment & Plan Note (Signed)
Symptoms controlled on prilosec.  Follow.   

## 2013-09-22 NOTE — Assessment & Plan Note (Signed)
Previously receiving testosterone injections.  Wanted to hold on injections previously.  Follow.

## 2013-09-22 NOTE — Assessment & Plan Note (Signed)
Reproducible on exam.  Check cxr and left anterior rib xray as outlined.  Further w/up pending.  Tylenol if needed.

## 2013-09-23 ENCOUNTER — Other Ambulatory Visit (INDEPENDENT_AMBULATORY_CARE_PROVIDER_SITE_OTHER): Payer: BC Managed Care – PPO

## 2013-09-23 ENCOUNTER — Encounter: Payer: Self-pay | Admitting: *Deleted

## 2013-09-23 ENCOUNTER — Other Ambulatory Visit: Payer: Self-pay | Admitting: *Deleted

## 2013-09-23 DIAGNOSIS — R319 Hematuria, unspecified: Secondary | ICD-10-CM

## 2013-09-23 LAB — URINALYSIS, ROUTINE W REFLEX MICROSCOPIC
BILIRUBIN URINE: NEGATIVE
Hgb urine dipstick: NEGATIVE
Ketones, ur: NEGATIVE
LEUKOCYTES UA: NEGATIVE
NITRITE: NEGATIVE
PH: 7 (ref 5.0–8.0)
RBC / HPF: NONE SEEN (ref 0–?)
SPECIFIC GRAVITY, URINE: 1.01 (ref 1.000–1.030)
Total Protein, Urine: NEGATIVE
UROBILINOGEN UA: 0.2 (ref 0.0–1.0)
Urine Glucose: NEGATIVE
WBC, UA: NONE SEEN (ref 0–?)

## 2013-09-30 ENCOUNTER — Encounter: Payer: Self-pay | Admitting: *Deleted

## 2013-10-06 ENCOUNTER — Telehealth: Payer: Self-pay | Admitting: *Deleted

## 2013-10-06 NOTE — Telephone Encounter (Signed)
Pt presented to office with lots of questions regarding his diet, low glycemic food options, and Dr. Lupita Dawn diet that he was given after his recent labs. Discussed with pt how the glycemic chart is to be used, and that Dr. Lupita Dawn diet is just a guideline. Pt states he eats at odd hours and only eats one large meal a day and needs a more strict plan and portion control to help him change his diet. Per Dr. Nicki Reaper, notified pt to schedule an appointment to discuss in more detail and possible nutritionist referral, pt verbalized understanding. Pt walked to front desk to schedule an appointment.

## 2013-10-08 ENCOUNTER — Encounter: Payer: Self-pay | Admitting: Adult Health

## 2013-10-08 ENCOUNTER — Ambulatory Visit (INDEPENDENT_AMBULATORY_CARE_PROVIDER_SITE_OTHER): Payer: BC Managed Care – PPO | Admitting: Adult Health

## 2013-10-08 VITALS — BP 106/66 | HR 72 | Resp 16 | Ht 72.0 in | Wt 171.8 lb

## 2013-10-08 DIAGNOSIS — Z713 Dietary counseling and surveillance: Secondary | ICD-10-CM

## 2013-10-08 DIAGNOSIS — E781 Pure hyperglyceridemia: Secondary | ICD-10-CM

## 2013-10-08 NOTE — Progress Notes (Signed)
Patient ID: Howard Berry, male   DOB: 11-29-1960, 53 y.o.   MRN: [id number]   Subjective:    Patient ID: Howard Berry, male    DOB: 1960/10/19, 53 y.o.   MRN: [id number]  HPI 53 yo male hypertriglyceridemia presents today with questions regarding glycemic diet that he received during his previous visit with Dr. Nicki Reaper 2 weeks ago. He describes having difficulty making changes due to his work schedule. He has a dietary log present with him and a list of questions from his wife regarding the recommended eating plan. He reports that he cannot follow this plan "because he is going to starve". He brings a legal pad with 3 pages full of questions, sample meals, what he eats on weekends, weekends when he is at the beach, etc.   Past Medical History  Diagnosis Date  . Hypercholesterolemia   . Recurrent sinusitis   . GERD (gastroesophageal reflux disease)     EGD (2007) - erosive duodenitis, reflux esophagitis, multiple gastric polyps  required dilatation.  . Left axis deviation     noted on prior EKG    Current Outpatient Prescriptions on File Prior to Visit  Medication Sig Dispense Refill  . albuterol (PROVENTIL HFA;VENTOLIN HFA) 108 (90 BASE) MCG/ACT inhaler Inhale 2 puffs into the lungs every 6 (six) hours as needed for wheezing.  1 Inhaler  0  . omeprazole (PRILOSEC) 40 MG capsule Take 1 capsule (40 mg total) by mouth daily.  90 capsule  3   No current facility-administered medications on file prior to visit.     Review of Systems  Constitutional: Negative.   HENT: Negative.   Eyes: Negative.   Respiratory: Negative.   Cardiovascular: Negative.   Gastrointestinal: Negative.   Endocrine: Negative.   Genitourinary: Negative.   Musculoskeletal: Negative.   Skin: Negative.   Allergic/Immunologic: Negative.   Neurological: Negative.   Hematological: Negative.   Psychiatric/Behavioral: Negative.        Objective:  BP 106/66  Pulse 72  Resp 16  Ht 6' (1.829 m)  Wt 171 lb 12 oz  (77.905 kg)  BMI 23.29 kg/m2  SpO2 98%   Physical Exam  Constitutional: He is oriented to person, place, and time. He appears well-developed and well-nourished. No distress.  HENT:  Head: Normocephalic and atraumatic.  Eyes: Conjunctivae and EOM are normal.  Neck: Neck supple.  Cardiovascular: Normal rate and regular rhythm.   Pulmonary/Chest: Effort normal. No respiratory distress.  Musculoskeletal: Normal range of motion.  Neurological: He is alert and oriented to person, place, and time. Coordination normal.  Skin: Skin is warm and dry.  Psychiatric: He has a normal mood and affect. His behavior is normal. Judgment and thought content normal.      Assessment & Plan:   1. Dietary counseling Spent greater than 45 min in counseling about his elevated triglycerides and about diet to help lower triglycerides. He was provided with sample foods that have low glycemic index during previous visit. He is confused about this. He  eating fried foods and sweets. Says he cannot follow the diet we recommended because he will starve. I spent time giving him detailed examples of foods to eat such as grilled, baked or broiled chicken but each time he had an excuse as to why he could not eat that. In this particular instance he stated he only eats barbeque chicken loaded in barbeque sauce. He does not like many fruits or vegetables. Wants to eat thousand island dressing. No other dressing will  do. After feeling like we were getting no where with the diet counseling I advised that he eat lean cuts of protein (beef, chicken, fish, [place]), no fried foods or concentrated sweets. Advised to start exercising. I explained to him that he did not need to lose weight but he needed to eat healthier choices and start exercising.   2. Hypertriglyceridemia Needs to have this rechecked in approximately 3 months. If no improvement will need to start medication to lower triglycerides.

## 2013-10-08 NOTE — Patient Instructions (Signed)
  Lean cuts of meat, chicken, pork, fish.  Vegetables, salads (no croutons)  Fruits (avoid pineapple, watermelon)  Brown rice, potato and bread in moderation  No fried foods. No concentrated sweets.  No sports drinks with high sugar contents.  Drink water, water, water  No sweet tea  Try un-sweet with stevia

## 2013-10-08 NOTE — Progress Notes (Signed)
Pre visit review using our clinic review tool, if applicable. No additional management support is needed unless otherwise documented below in the visit note. 

## 2013-10-09 ENCOUNTER — Telehealth: Payer: Self-pay | Admitting: Internal Medicine

## 2013-10-09 NOTE — Telephone Encounter (Signed)
Needing a referral for [facility] for his diabetes counseling .

## 2013-10-09 NOTE — Telephone Encounter (Signed)
Pt saw Howard Berry yesterday & walked in again today (see below)

## 2013-10-09 NOTE — Telephone Encounter (Signed)
I do not mind referring him to [facility] if he desires.  He does not have diabetes.  This would be to sit with a nutritionist. And discussed dietary recommendations.  We can make referral.  I am not sure his insurance will cover.  Let me know if he still wants referral.

## 2013-10-14 NOTE — Telephone Encounter (Signed)
Left detailed message for pt to call back & let us know if would like to proceed with [facility] referral.

## 2013-10-16 NOTE — Telephone Encounter (Signed)
Pt states that he has an appt on July 18th with the Nutritionist

## 2013-11-09 ENCOUNTER — Telehealth: Payer: Self-pay | Admitting: Internal Medicine

## 2013-11-09 NOTE — Telephone Encounter (Signed)
Pt came into office to check status of referral to dietician.  States Dr. Nicki Reaper was referring him.  No order in.  Pt states he needs to know if he is being referred or not so he can cancel the appt.  States the appt has been made at [facility] Monday, 7/13.

## 2013-11-09 NOTE — Telephone Encounter (Signed)
I am not sure that I understand this message.  The dietician is located at [facility].  If he has an appt, then he does not need me to place an order for a referral.  If he is questioning if insurance will pay, the [employer] or his insurance will help with this.  Let me know if I need to do something more.  Thanks.

## 2013-11-10 NOTE — Telephone Encounter (Signed)
Left detailed message.   

## 2013-11-10 NOTE — Telephone Encounter (Signed)
Pt called back stating that he needs a referral to [facility]. They told him that they do not have a referral on file for him. Please call patient once referral is made.

## 2013-11-12 NOTE — Telephone Encounter (Signed)
I filled out the [facility] ref sheet, will fax once signed, thanks!!

## 2013-11-16 ENCOUNTER — Ambulatory Visit: Payer: Self-pay | Admitting: Internal Medicine

## 2013-12-04 ENCOUNTER — Telehealth: Payer: Self-pay | Admitting: *Deleted

## 2013-12-04 NOTE — Telephone Encounter (Signed)
Pt called states he was to have a lab appointment on 8.11.15, appointment not seen in system.  However pt states he needs the appointment scheduled at [facility] early in the morning.  Please advise

## 2013-12-05 ENCOUNTER — Ambulatory Visit: Payer: Self-pay | Admitting: Internal Medicine

## 2013-12-05 NOTE — Telephone Encounter (Signed)
Is he saying that he needs the lab appt at [facility]?  If so, I cannot schedule appts at [facility].  I can write an order for lab to be done there that he can come by and pick up or will be mailed to him.  Just let me know.

## 2013-12-07 NOTE — Telephone Encounter (Signed)
Yes pt wants an order for labs to be drawn at [facility].

## 2013-12-09 NOTE — Telephone Encounter (Signed)
rx written and placed on your desk.  Thanks

## 2013-12-09 NOTE — Telephone Encounter (Signed)
Pt to pick up order today, placed up front

## 2013-12-09 NOTE — Telephone Encounter (Signed)
Left message for pt to return my call.

## 2013-12-15 ENCOUNTER — Other Ambulatory Visit: Payer: BC Managed Care – PPO

## 2013-12-17 ENCOUNTER — Other Ambulatory Visit: Payer: Self-pay | Admitting: Internal Medicine

## 2013-12-17 LAB — COMPREHENSIVE METABOLIC PANEL
AST: 19 U/L (ref 15–37)
Albumin: 4.2 g/dL (ref 3.4–5.0)
Alkaline Phosphatase: 84 U/L
Anion Gap: 4 — ABNORMAL LOW (ref 7–16)
BUN: 17 mg/dL (ref 7–18)
Bilirubin,Total: 0.5 mg/dL (ref 0.2–1.0)
CO2: 30 mmol/L (ref 21–32)
Calcium, Total: 8.9 mg/dL (ref 8.5–10.1)
Chloride: 104 mmol/L (ref 98–107)
Creatinine: 1.13 mg/dL (ref 0.60–1.30)
EGFR (Non-African Amer.): >60
GLUCOSE: 91 mg/dL (ref 65–99)
OSMOLALITY: 277 (ref 275–301)
Potassium: 4.3 mmol/L (ref 3.5–5.1)
SGPT (ALT): 30 U/L
Sodium: 138 mmol/L (ref 136–145)
TOTAL PROTEIN: 8.1 g/dL (ref 6.4–8.2)

## 2013-12-17 LAB — LIPID PANEL
CHOLESTEROL: 174 mg/dL (ref 0–200)
HDL: 38 mg/dL — AB (ref 40–60)
Ldl Cholesterol, Calc: 107 mg/dL — ABNORMAL HIGH (ref 0–100)
TRIGLYCERIDES: 147 mg/dL (ref 0–200)
VLDL CHOLESTEROL, CALC: 29 mg/dL (ref 5–40)

## 2013-12-18 ENCOUNTER — Ambulatory Visit: Payer: BC Managed Care – PPO | Admitting: Internal Medicine

## 2013-12-21 ENCOUNTER — Other Ambulatory Visit: Payer: BC Managed Care – PPO

## 2014-02-18 ENCOUNTER — Encounter: Payer: Self-pay | Admitting: Internal Medicine

## 2014-02-18 ENCOUNTER — Ambulatory Visit (INDEPENDENT_AMBULATORY_CARE_PROVIDER_SITE_OTHER): Payer: BC Managed Care – PPO | Admitting: Internal Medicine

## 2014-02-18 VITALS — BP 110/70 | HR 89 | Temp 99.0°F | Ht 72.0 in | Wt 171.8 lb

## 2014-02-18 DIAGNOSIS — E7800 Pure hypercholesterolemia, unspecified: Secondary | ICD-10-CM

## 2014-02-18 DIAGNOSIS — Z125 Encounter for screening for malignant neoplasm of prostate: Secondary | ICD-10-CM

## 2014-02-18 DIAGNOSIS — K219 Gastro-esophageal reflux disease without esophagitis: Secondary | ICD-10-CM

## 2014-02-18 DIAGNOSIS — R319 Hematuria, unspecified: Secondary | ICD-10-CM

## 2014-02-18 NOTE — Progress Notes (Signed)
Pre visit review using our clinic review tool, if applicable. No additional management support is needed unless otherwise documented below in the visit note. 

## 2014-02-28 ENCOUNTER — Encounter: Payer: Self-pay | Admitting: Internal Medicine

## 2014-02-28 NOTE — Progress Notes (Signed)
Subjective:    Patient ID: Howard Berry, male    DOB: [birth year], 53 y.o.   MRN: [id number]  HPI 53 year old male with past history of hypercholesterolemia who comes in today for a scheduled follow up.  No chest pain or tightness.  No sob.  No cough or congestion.  Eating and drinking well.  Has adjusted his diet.  Watching his cholesterol.  No acid reflux.  No swallowing difficulty.  Stays active.  Reports no chest pain or tightness with increased activity or exertion.  No bowel change.       Past Medical History  Diagnosis Date  . Hypercholesterolemia   . Recurrent sinusitis   . GERD (gastroesophageal reflux disease)     EGD (2007) - erosive duodenitis, reflux esophagitis, multiple gastric polyps  required dilatation.  . Left axis deviation     noted on prior EKG    Current Outpatient Prescriptions on File Prior to Visit  Medication Sig Dispense Refill  . albuterol (PROVENTIL HFA;VENTOLIN HFA) 108 (90 BASE) MCG/ACT inhaler Inhale 2 puffs into the lungs every 6 (six) hours as needed for wheezing.  1 Inhaler  0  . omeprazole (PRILOSEC) 40 MG capsule Take 1 capsule (40 mg total) by mouth daily.  90 capsule  3   No current facility-administered medications on file prior to visit.    Review of Systems Patient denies any headache, lightheadedness or dizziness.  No significant sinus or allergy symptoms.  No chest pain.  No palpitations.  No chest tightness with increased activity or exertion.  No increased shortness of breath, cough or congestion.  No nausea or vomiting.  No acid reflux.  No abdominal pain or cramping.  No bowel change, such as diarrhea, constipation, BRBPR or melana.  No urine change.  Overall he feels he is doing relatively well. Has adjusted his diet.  Watching his cholesterol.          Objective:   Physical Exam  Filed Vitals:   02/18/14 1551  BP: 110/70  Pulse: 89  Temp: 99 F (37.2 C)   Blood pressure recheck:  5347/59  53 year old male in no acute distress.   HEENT:  Nares - clear.  Oropharynx - without lesions. NECK:  Supple.  Nontender.  No audible carotid bruit.  HEART:  Appears to be regular.   LUNGS:  No crackles or wheezing audible.  Respirations even and unlabored.   RADIAL PULSE:  Equal bilaterally.  ABDOMEN:  Soft.  Nontender.  Bowel sounds present and normal.  No audible abdominal bruit.  EXTREMITIES:  No increased edema present.  DP pulses palpable and equal bilaterally.          Assessment & Plan:  CHEST PAIN.  Resolved.      PREVIOUS LEFT ANTERIOR CERVICAL MASS.  Saw ENT.  Recommended no further w/up.  HEALTH MAINTENANCE.  Physical 09/16/13.  Check psa.    Problem List Items Addressed This Visit   GERD (gastroesophageal reflux disease) - Primary     Symptoms controlled on prilosec.  Follow.      Hematuria     Saw Dr Quillian Quince.  Had cystoscopy 10/11 - revealed an irregular area in the bladder.  See previous note.  Have recommended follow up.  He has declined.  Urinalysis 09/23/13 - no red blood cells.         Hypercholesterolemia     Low cholesterol diet.  Has adjusted his diet.  Follow lipid panel.

## 2014-02-28 NOTE — Assessment & Plan Note (Signed)
Low cholesterol diet.  Has adjusted his diet.  Follow lipid panel.

## 2014-02-28 NOTE — Assessment & Plan Note (Signed)
Saw Dr Quillian Quince.  Had cystoscopy 10/11 - revealed an irregular area in the bladder.  See previous note.  Have recommended follow up.  He has declined.  Urinalysis 09/23/13 - no red blood cells.

## 2014-02-28 NOTE — Assessment & Plan Note (Signed)
Symptoms controlled on prilosec.  Follow.   

## 2014-03-05 ENCOUNTER — Other Ambulatory Visit (INDEPENDENT_AMBULATORY_CARE_PROVIDER_SITE_OTHER): Payer: BC Managed Care – PPO

## 2014-03-05 DIAGNOSIS — E7800 Pure hypercholesterolemia, unspecified: Secondary | ICD-10-CM

## 2014-03-05 DIAGNOSIS — Z125 Encounter for screening for malignant neoplasm of prostate: Secondary | ICD-10-CM

## 2014-03-05 LAB — COMPREHENSIVE METABOLIC PANEL
ALT: 29 U/L (ref 0–53)
AST: 19 U/L (ref 0–37)
Albumin: 3.8 g/dL (ref 3.5–5.2)
Alkaline Phosphatase: 76 U/L (ref 39–117)
BILIRUBIN TOTAL: 0.9 mg/dL (ref 0.2–1.2)
BUN: 14 mg/dL (ref 6–23)
CO2: 30 mEq/L (ref 19–32)
CREATININE: 1 mg/dL (ref 0.4–1.5)
Calcium: 9.2 mg/dL (ref 8.4–10.5)
Chloride: 102 mEq/L (ref 96–112)
GFR: 84.72 mL/min (ref >60.00)
Glucose, Bld: 85 mg/dL (ref 70–99)
Potassium: 4.4 mEq/L (ref 3.5–5.1)
Sodium: 138 mEq/L (ref 135–145)
Total Protein: 7.3 g/dL (ref 6.0–8.3)

## 2014-03-05 LAB — LIPID PANEL
Cholesterol: 210 mg/dL — ABNORMAL HIGH (ref 0–200)
HDL: 39.3 mg/dL (ref >39.00)
LDL Cholesterol: 147 mg/dL — ABNORMAL HIGH (ref 0–99)
NonHDL: 170.7
Total CHOL/HDL Ratio: 5
Triglycerides: 121 mg/dL (ref 0.0–149.0)
VLDL: 24.2 mg/dL (ref 0.0–40.0)

## 2014-03-05 LAB — PSA: PSA: 0.7 ng/mL (ref 0.10–4.00)

## 2014-03-08 ENCOUNTER — Encounter: Payer: Self-pay | Admitting: *Deleted

## 2014-08-27 NOTE — Op Note (Signed)
PATIENT NAME:  Howard Berry, Howard Berry MR#:  [id number] DATE OF BIRTH:  Jul 27, 1960  DATE OF PROCEDURE:  09/26/2012  DATE OF DICTATION:  09/27/2012  PREOPERATIVE DIAGNOSIS:  Acute calculus cholecystitis.   POSTOPERATIVE DIAGNOSIS:  Acute calculus cholecystitis.   PROCEDURE PERFORMED:  Laparoscopic cholecystectomy.   SURGEON:  Sherri Rad, M.D.   ASSISTANT:  None.   ANESTHESIA:  General endotracheal.   FINDINGS:  As above.  ESTIMATED BLOOD LOSS:  100 mL  DRAINS:  19 mm Blake drain gallbladder fossa.   DESCRIPTION OF PROCEDURE: With informed consent in supine position, general endotracheal anesthesia, sterile clipping of hair, prep and drape of the abdomen, timeout was observed.   Berry 12 mm blunt Hasson trocar was placed through an open technique through an infraumbilical skin incision. Pneumoperitoneum was established. Berry 5 mm bladeless trocar was placed in the epigastric region. Two 5 mm ports were then placed in the right lateral abdomen. The gallbladder was decompressed of thick motor oil bile, approximately 30 mL was grasped along its fundus and elevated towards the right shoulder. Lateral traction was achieved on Hartmann's pouch. There was an inflammatory rind around the hepatoduodenal ligament which was taken down with blunt technique and hook electrocautery through the peritoneal reflections. Cystic artery and cystic duct were critically identified with Berry critical view of safety cholecystectomy. Three clips were placed on the portal side of each structure, one clip on the gallbladder side and the structures were then divided with scissors. The gallbladder was then retrieved off the gallbladder fossa utilizing hook cautery apparatus without difficulty, placed into an Endo Catch device and retrieved. The right upper quadrant was irrigated with Berry total of 2 liters of normal saline during the operation and aspirated dry. Point hemostasis in the gallbladder fossa was achieved with electrocautery and the  application of 10 mL of Surgiflo with thrombin application. Berry 19 mm Blake drain was directed into Morison's pouch. Drain site was secured with nylon suture. Ports were then removed under direct visualization. The infraumbilical fascial defect was closed with multiple interrupted simple and vertical figure-of-eight, #0 Vicryl suture in vertical orientation. Berry total of 20 mL of 0.25% plain Marcaine was infiltrated along all skin and fascial incisions prior to closure. 4-0 Vicryl subcuticular was applied to all skin edges.   Followed by benzoin, Steri-Strips, Telfa and Tegaderm. The patient was then subsequently extubated and taken to the recovery room in stable and satisfactory condition by anesthesia services.   ____________________________ Jeannette How Marina Gravel, MD mab:ce D: 09/27/2012 08:32:51 ET T: 09/27/2012 14:45:09 ET JOB#: [id number]  cc: Elta Guadeloupe Berry. Marina Gravel, MD, <Dictator> Einar Pheasant, MD  [place] MD ELECTRONICALLY SIGNED 09/30/2012 13:09

## 2014-08-27 NOTE — H&P (Signed)
Subjective/Chief Complaint 8 days of epigastric pain, nausea and vomiting   History of Present Illness 54 y/o otherwise healthy white male with about a 8-9 days hx of acute onset epigastric abdominal pain followed by nausea and emesis along with chills,  Poor po intake since last week.  Labs on 5/19 showed leukocytosis.  LFT's normal.  Does not drink ETOH, smokeless tob pos.  no other medical history  eye surgery  bilateral groin hernia repair as toddler.   Primary Physician [facility]   ALLERGIES:  Penicillin: Unknown  HOME MEDICATIONS: Medication Instructions Status  omeprazole 40 mg oral delayed release capsule 1 cap(s) orally once a day Active   Family and Social History:  Family History Non-Contributory   Social History positive  tobacco, positive  tobacco (Current within 1 year), negative ETOH   + Tobacco Current (within 1 year)   Place of Living Home   Review of Systems:  Subjective/Chief Complaint see above   Physical Exam:  GEN no acute distress, thin   HEENT pale conjunctivae, PERRL, hearing intact to voice   NECK supple   RESP normal resp effort  clear BS   CARD regular rate   ABD positive tenderness  no liver/spleen enlargement  soft  positive murphy's sign   LYMPH negative neck, negative axillae   EXTR negative cyanosis/clubbing   SKIN normal to palpation   NEURO cranial nerves intact, negative rigidity, follows commands   PSYCH alert, A+O to time, place, person, good insight   Radiology Results: [place]:    23-May-14 09:00, US Abdomen General Survey  US Abdomen General Survey  REASON FOR EXAM:    complete  RUQ epigastric abd pain  vomiting w nausea  COMMENTS:       PROCEDURE: [place]  - US ABDOMEN GENERAL SURVEY  - Sep 26 2012  9:00AM     RESULT: Comparison: None    Technique: Multiple gray-scale and color-flow Doppler images of the   abdomen are presented for review.    Findings:    Visualized portions of the liver demonstrate normal  echogenicity and   normal contours. The liver is without evidence of a focal hepatic lesion.   There are cholelithiasis. There is no intra- or extrahepatic biliary   ductal dilatation. The common duct measures 3.2 mm in maximal diameter.   There is gallbladder wall thickening measuring 7.93mm. No pericholecystic   fluid. There is a sonographic Murphy's sign.     The visualized portion of the pancreas is normal in echogenicity. The   spleen is unremarkable. Bilateral kidneys are normal in echogenicity and   size. The right kidney measures 10.9 x 5 x 5.2 cm. The left kidney   measures 10.9 x 5.1 x 5.9 cm. There are no renal calculi or   hydronephrosis. The abdominal aorta and IVC are unremarkable.    IMPRESSION:     1. Cholelithiasis with gallbladder wall thickening concerning for acute   cholecystitis. Recommend surgical consultation.  Dictation Site: 1        Verified By: Jennette Banker, M.D., MD  LabUnknown:  PACS Image    Assessment/Admission Diagnosis 54 y/o with acute calculus cholecystitis   Plan admit, check lfts and lipase, start invanz, narcotics, anti-emetics.  plan chole this admission.   Electronic Signatures: Sherri Rad (MD)  (Signed 23-May-14 14:20)  Authored: CHIEF COMPLAINT and HISTORY, ALLERGIES, HOME MEDICATIONS, FAMILY AND SOCIAL HISTORY, REVIEW OF SYSTEMS, PHYSICAL EXAM, Radiology, ASSESSMENT AND PLAN   Last Updated: 23-May-14 14:20 by Marina Gravel  Elta Guadeloupe (MD)

## 2014-09-22 ENCOUNTER — Ambulatory Visit (INDEPENDENT_AMBULATORY_CARE_PROVIDER_SITE_OTHER): Payer: BLUE CROSS/BLUE SHIELD | Admitting: Internal Medicine

## 2014-09-22 ENCOUNTER — Encounter: Payer: Self-pay | Admitting: Internal Medicine

## 2014-09-22 VITALS — BP 100/80 | HR 73 | Temp 97.9°F | Ht 71.5 in | Wt 168.0 lb

## 2014-09-22 DIAGNOSIS — R319 Hematuria, unspecified: Secondary | ICD-10-CM | POA: Diagnosis not present

## 2014-09-22 DIAGNOSIS — E7800 Pure hypercholesterolemia, unspecified: Secondary | ICD-10-CM

## 2014-09-22 DIAGNOSIS — Z0000 Encounter for general adult medical examination without abnormal findings: Secondary | ICD-10-CM

## 2014-09-22 DIAGNOSIS — K219 Gastro-esophageal reflux disease without esophagitis: Secondary | ICD-10-CM

## 2014-09-22 DIAGNOSIS — J3489 Other specified disorders of nose and nasal sinuses: Secondary | ICD-10-CM

## 2014-09-22 LAB — CBC WITH DIFFERENTIAL/PLATELET
BASOS ABS: 0 10*3/uL (ref 0.0–0.1)
Basophils Relative: 0.6 % (ref 0.0–3.0)
EOS ABS: 0.2 10*3/uL (ref 0.0–0.7)
Eosinophils Relative: 2.5 % (ref 0.0–5.0)
HCT: 44.1 % (ref 39.0–52.0)
HEMOGLOBIN: 15 g/dL (ref 13.0–17.0)
LYMPHS PCT: 26.1 % (ref 12.0–46.0)
Lymphs Abs: 1.9 10*3/uL (ref 0.7–4.0)
MCHC: 34 g/dL (ref 30.0–36.0)
MCV: 88.7 fl (ref 78.0–100.0)
Monocytes Absolute: 0.6 10*3/uL (ref 0.1–1.0)
Monocytes Relative: 8.2 % (ref 3.0–12.0)
NEUTROS ABS: 4.5 10*3/uL (ref 1.4–7.7)
NEUTROS PCT: 62.6 % (ref 43.0–77.0)
PLATELETS: 228 10*3/uL (ref 150.0–400.0)
RBC: 4.97 Mil/uL (ref 4.22–5.81)
RDW: 13.2 % (ref 11.5–15.5)
WBC: 7.2 10*3/uL (ref 4.0–10.5)

## 2014-09-22 LAB — COMPREHENSIVE METABOLIC PANEL
ALBUMIN: 4.4 g/dL (ref 3.5–5.2)
ALT: 15 U/L (ref 0–53)
AST: 16 U/L (ref 0–37)
Alkaline Phosphatase: 78 U/L (ref 39–117)
BUN: 12 mg/dL (ref 6–23)
CALCIUM: 9.6 mg/dL (ref 8.4–10.5)
CHLORIDE: 101 meq/L (ref 96–112)
CO2: 31 mEq/L (ref 19–32)
Creatinine, Ser: 0.97 mg/dL (ref 0.40–1.50)
GFR: 85.55 mL/min (ref >60.00)
Glucose, Bld: 91 mg/dL (ref 70–99)
POTASSIUM: 4.4 meq/L (ref 3.5–5.1)
Sodium: 137 mEq/L (ref 135–145)
TOTAL PROTEIN: 7.5 g/dL (ref 6.0–8.3)
Total Bilirubin: 0.5 mg/dL (ref 0.2–1.2)

## 2014-09-22 LAB — LIPID PANEL
CHOL/HDL RATIO: 5
Cholesterol: 185 mg/dL (ref 0–200)
HDL: 35.3 mg/dL — ABNORMAL LOW (ref >39.00)
NONHDL: 149.7
TRIGLYCERIDES: 223 mg/dL — AB (ref 0.0–149.0)
VLDL: 44.6 mg/dL — ABNORMAL HIGH (ref 0.0–40.0)

## 2014-09-22 LAB — LDL CHOLESTEROL, DIRECT: LDL DIRECT: 86 mg/dL

## 2014-09-22 LAB — TSH: TSH: 0.67 u[IU]/mL (ref 0.35–4.50)

## 2014-09-22 MED ORDER — MUPIROCIN 2 % EX OINT: 1.0000 "application " | TOPICAL_OINTMENT | Freq: Two times a day (BID) | CUTANEOUS | Status: DC

## 2014-09-22 NOTE — Progress Notes (Signed)
Patient ID: Howard Berry, male   DOB: 1960-11-27, 54 y.o.   MRN: [id number]   Subjective:    Patient ID: Howard Berry, male    DOB: [birth year], 54 y.o.   MRN: [id number]  HPI  Patient here to follow up on his current medical issues as well as for his physical exam.  Trying to watch his diet.  Has cut back on sugar and carbs.  Stays active.  No cardiac symptoms with increased activity or exertion.  Eats regular meals.  Weight is stable from last two checks.  Bowels stable.  No abdominal pain or cramping.  No urine change.  Reports crusting (persistent) - nose.  Picks at it.     Past Medical History  Diagnosis Date  . Hypercholesterolemia   . Recurrent sinusitis   . GERD (gastroesophageal reflux disease)     EGD (2007) - erosive duodenitis, reflux esophagitis, multiple gastric polyps  required dilatation.  . Left axis deviation     noted on prior EKG    Current Outpatient Prescriptions on File Prior to Visit  Medication Sig Dispense Refill  . omeprazole (PRILOSEC) 40 MG capsule Take 1 capsule (40 mg total) by mouth daily. 90 capsule 3   No current facility-administered medications on file prior to visit.    Review of Systems  Constitutional: Negative for appetite change and unexpected weight change.  HENT: Negative for congestion and sinus pressure.   Eyes: Negative for pain and visual disturbance.  Respiratory: Negative for cough, chest tightness and shortness of breath.   Cardiovascular: Negative for chest pain, palpitations and leg swelling.  Gastrointestinal: Negative for nausea, vomiting, abdominal pain and diarrhea.  Genitourinary: Negative for dysuria and difficulty urinating.  Musculoskeletal: Negative for back pain and joint swelling.  Skin: Negative for color change and rash.  Neurological: Negative for dizziness, light-headedness and headaches.  Hematological: Negative for adenopathy. Does not bruise/bleed easily.  Psychiatric/Behavioral: Negative for dysphoric mood and  agitation.       Objective:    Physical Exam  Constitutional: He is oriented to person, place, and time. He appears well-developed and well-nourished. No distress.  HENT:  Head: Normocephalic and atraumatic.  Nose: Nose normal.  Mouth/Throat: Oropharynx is clear and moist. No oropharyngeal exudate.  Eyes: Conjunctivae are normal. Right eye exhibits no discharge. Left eye exhibits no discharge.  Neck: Neck supple. No thyromegaly present.  Cardiovascular: Normal rate and regular rhythm.   Pulmonary/Chest: Breath sounds normal. No respiratory distress. He has no wheezes.  Abdominal: Soft. Bowel sounds are normal. There is no tenderness.  Genitourinary:  Normal descended testicles.  Could not appreciate any testicular nodules.  Rectal exam - no palpable prostate noduels.  Heme negative.    Musculoskeletal: He exhibits no edema or tenderness.  Lymphadenopathy:    He has no cervical adenopathy.  Neurological: He is alert and oriented to person, place, and time.  Skin: Skin is warm and dry. No rash noted.  Psychiatric: He has a normal mood and affect. His behavior is normal.    BP 100/80 mmHg  Pulse 73  Temp(Src) 97.9 F (36.6 C) (Oral)  Ht 5' 11.5" (1.816 m)  Wt 168 lb (76.204 kg)  BMI 23.11 kg/m2  SpO2 97% Wt Readings from Last 3 Encounters:  09/22/14 168 lb (76.204 kg)  02/18/14 171 lb 12 oz (77.905 kg)  10/08/13 171 lb 12 oz (77.905 kg)     Lab Results  Component Value Date   WBC 7.6 07/30/2013   HGB  14.8 07/30/2013   HCT 43.3 07/30/2013   PLT 238.0 07/30/2013   GLUCOSE 85 03/05/2014   CHOL 210* 03/05/2014   TRIG 121.0 03/05/2014   HDL 39.30 03/05/2014   LDLCALC 147* 03/05/2014   ALT 29 03/05/2014   AST 19 03/05/2014   NA 138 03/05/2014   K 4.4 03/05/2014   CL 102 03/05/2014   CREATININE 1.0 03/05/2014   BUN 14 03/05/2014   CO2 30 03/05/2014   TSH 0.53 09/22/2013   PSA 0.70 03/05/2014       Assessment & Plan:   Problem List Items Addressed This Visit      GERD (gastroesophageal reflux disease) - Primary    On omeprazole.  No upper symptoms.  Watching his diet.        Health care maintenance    Physical 09/22/14.  PSA 03/05/14 - .70.  Had colonoscopy a few years ago.  Obtain results from [place].        Hematuria    Saw Dr Quillian Quince previously.  Had cystoscopy 02/2010 - revealed an irregular area in the bladder.  See previous note.  Had recommended f/u.  He declines and continues to decline.  Discussed again with him today.  Urinalysis 09/23/13 - no red blood cells.  Recheck.  Wants to provide sample at home and bring in. Has cup.        Relevant Orders   Urinalysis, Routine w reflex microscopic   CBC with Differential/Platelet   Hypercholesterolemia    Has adjusted his diet.  Check lipid panel today.  Stays active.        Relevant Orders   Lipid panel   Comprehensive metabolic panel   TSH   Nasal crusting    Persistent.  bactroban ointment as directed.  Notify me if persistent.           Einar Pheasant, MD

## 2014-09-22 NOTE — Assessment & Plan Note (Addendum)
Saw Dr Quillian Quince previously.  Had cystoscopy 02/2010 - revealed an irregular area in the bladder.  See previous note.  Had recommended f/u.  He declines and continues to decline.  Discussed again with him today.  Urinalysis 09/23/13 - no red blood cells.  Recheck.  Wants to provide sample at home and bring in. Has cup.

## 2014-09-22 NOTE — Assessment & Plan Note (Signed)
On omeprazole.  No upper symptoms.  Watching his diet.

## 2014-09-22 NOTE — Assessment & Plan Note (Signed)
Physical 09/22/14.  PSA 03/05/14 - .70.  Had colonoscopy a few years ago.  Obtain results from [place].

## 2014-09-22 NOTE — Assessment & Plan Note (Signed)
Has adjusted his diet.  Check lipid panel today.  Stays active.

## 2014-09-22 NOTE — Assessment & Plan Note (Signed)
Persistent.  bactroban ointment as directed.  Notify me if persistent.

## 2014-09-22 NOTE — Progress Notes (Signed)
Pre visit review using our clinic review tool, if applicable. No additional management support is needed unless otherwise documented below in the visit note. 

## 2014-09-22 NOTE — Addendum Note (Signed)
Addended by: Karlene Einstein D on: 09/22/2014 09:19 AM   Modules accepted: Orders

## 2014-09-27 ENCOUNTER — Encounter: Payer: Self-pay | Admitting: *Deleted

## 2015-07-21 ENCOUNTER — Other Ambulatory Visit: Payer: Self-pay | Admitting: Internal Medicine

## 2015-10-19 ENCOUNTER — Other Ambulatory Visit: Payer: Self-pay | Admitting: Internal Medicine

## 2015-10-27 ENCOUNTER — Telehealth: Payer: Self-pay | Admitting: Internal Medicine

## 2015-10-27 MED ORDER — OMEPRAZOLE 40 MG PO CPDR: 40.0000 mg | DELAYED_RELEASE_CAPSULE | Freq: Every day | ORAL | Status: DC

## 2015-10-27 NOTE — Telephone Encounter (Signed)
Left message on cellphone to return call.  Patient refill has been ordered but he will need to keep appointment to receive further refills.

## 2015-10-27 NOTE — Telephone Encounter (Signed)
Pt is requesting a refill on his omeprazole (PRILOSEC) 40 MG capsule. He made an appointment for a physical on 01/31/16 at 9:30. Please call pt's cell phone.

## 2015-10-27 NOTE — Telephone Encounter (Signed)
Patient has Physical in September. Patient must keep appointment to receive further refills.

## 2016-01-25 ENCOUNTER — Other Ambulatory Visit: Payer: Self-pay | Admitting: Internal Medicine

## 2016-01-31 ENCOUNTER — Ambulatory Visit (INDEPENDENT_AMBULATORY_CARE_PROVIDER_SITE_OTHER): Payer: BLUE CROSS/BLUE SHIELD | Admitting: Internal Medicine

## 2016-01-31 ENCOUNTER — Encounter: Payer: Self-pay | Admitting: Internal Medicine

## 2016-01-31 VITALS — BP 108/60 | HR 68 | Temp 97.8°F | Ht 72.0 in | Wt 171.0 lb

## 2016-01-31 DIAGNOSIS — K219 Gastro-esophageal reflux disease without esophagitis: Secondary | ICD-10-CM

## 2016-01-31 DIAGNOSIS — E7800 Pure hypercholesterolemia, unspecified: Secondary | ICD-10-CM | POA: Diagnosis not present

## 2016-01-31 DIAGNOSIS — Z0000 Encounter for general adult medical examination without abnormal findings: Secondary | ICD-10-CM | POA: Diagnosis not present

## 2016-01-31 DIAGNOSIS — R319 Hematuria, unspecified: Secondary | ICD-10-CM

## 2016-01-31 DIAGNOSIS — Z125 Encounter for screening for malignant neoplasm of prostate: Secondary | ICD-10-CM

## 2016-01-31 LAB — CBC WITH DIFFERENTIAL/PLATELET
BASOS PCT: 0.5 % (ref 0.0–3.0)
Basophils Absolute: 0 10*3/uL (ref 0.0–0.1)
Eosinophils Absolute: 0.4 10*3/uL (ref 0.0–0.7)
Eosinophils Relative: 5.5 % — ABNORMAL HIGH (ref 0.0–5.0)
HCT: 42.5 % (ref 39.0–52.0)
HEMOGLOBIN: 14.5 g/dL (ref 13.0–17.0)
LYMPHS PCT: 26.2 % (ref 12.0–46.0)
Lymphs Abs: 1.9 10*3/uL (ref 0.7–4.0)
MCHC: 34.1 g/dL (ref 30.0–36.0)
MCV: 89.8 fl (ref 78.0–100.0)
MONO ABS: 0.6 10*3/uL (ref 0.1–1.0)
MONOS PCT: 8.1 % (ref 3.0–12.0)
Neutro Abs: 4.4 10*3/uL (ref 1.4–7.7)
Neutrophils Relative %: 59.7 % (ref 43.0–77.0)
Platelets: 230 10*3/uL (ref 150.0–400.0)
RBC: 4.73 Mil/uL (ref 4.22–5.81)
RDW: 13.1 % (ref 11.5–15.5)
WBC: 7.3 10*3/uL (ref 4.0–10.5)

## 2016-01-31 LAB — COMPREHENSIVE METABOLIC PANEL
ALT: 21 U/L (ref 0–53)
AST: 15 U/L (ref 0–37)
Albumin: 4.3 g/dL (ref 3.5–5.2)
Alkaline Phosphatase: 67 U/L (ref 39–117)
BUN: 15 mg/dL (ref 6–23)
CHLORIDE: 102 meq/L (ref 96–112)
CO2: 35 meq/L — AB (ref 19–32)
Calcium: 9.1 mg/dL (ref 8.4–10.5)
Creatinine, Ser: 0.97 mg/dL (ref 0.40–1.50)
GFR: 85.13 mL/min (ref >60.00)
GLUCOSE: 86 mg/dL (ref 70–99)
POTASSIUM: 4.5 meq/L (ref 3.5–5.1)
SODIUM: 141 meq/L (ref 135–145)
Total Bilirubin: 0.5 mg/dL (ref 0.2–1.2)
Total Protein: 7.3 g/dL (ref 6.0–8.3)

## 2016-01-31 LAB — URINALYSIS, ROUTINE W REFLEX MICROSCOPIC
BILIRUBIN URINE: NEGATIVE
KETONES UR: NEGATIVE
Leukocytes, UA: NEGATIVE
Nitrite: NEGATIVE
SPECIFIC GRAVITY, URINE: 1.025 (ref 1.000–1.030)
Total Protein, Urine: NEGATIVE
URINE GLUCOSE: NEGATIVE
Urobilinogen, UA: 0.2 (ref 0.0–1.0)
pH: 5.5 (ref 5.0–8.0)

## 2016-01-31 LAB — LIPID PANEL
CHOL/HDL RATIO: 5
Cholesterol: 168 mg/dL (ref 0–200)
HDL: 37.1 mg/dL — AB (ref >39.00)
LDL Cholesterol: 101 mg/dL — ABNORMAL HIGH (ref 0–99)
NONHDL: 130.69
Triglycerides: 148 mg/dL (ref 0.0–149.0)
VLDL: 29.6 mg/dL (ref 0.0–40.0)

## 2016-01-31 LAB — PSA: PSA: 0.27 ng/mL (ref 0.10–4.00)

## 2016-01-31 LAB — TSH: TSH: 0.65 u[IU]/mL (ref 0.35–4.50)

## 2016-01-31 NOTE — Assessment & Plan Note (Signed)
Controlled on omeprazole.   

## 2016-01-31 NOTE — Assessment & Plan Note (Addendum)
Physical today 01/31/16.  Check psa with labs.  Colonoscopy 2012.  States recommended f/u in 10 years.

## 2016-01-31 NOTE — Assessment & Plan Note (Signed)
Previously worked up by urology.  Had recommended f/u.  He declined.  Check urinalysis.

## 2016-01-31 NOTE — Progress Notes (Signed)
Patient ID: Howard Berry, male   DOB: Apr 20, 1961, 55 y.o.   MRN: [id number]   Subjective:    Patient ID: Howard Berry, male    DOB: [birth year], 55 y.o.   MRN: [id number]  HPI  Patient here for a physical exam.  Is now working first shift.  Just started two weeks ago.  Body is adjusting.  Stays active.  No chest pain.  No sob.  No acid reflux as long as he takes his medication.   No abdominal pain or cramping.  Bowels stable.  Previously had some increased congestion.  Better.    Past Medical History:  Diagnosis Date  . GERD (gastroesophageal reflux disease)    EGD (2007) - erosive duodenitis, reflux esophagitis, multiple gastric polyps  required dilatation.  . Hypercholesterolemia   . Left axis deviation    noted on prior EKG  . Recurrent sinusitis    Past Surgical History:  Procedure Laterality Date  . CHOLECYSTECTOMY  10/02/12   emergency surgery  . [place]  . TONSILLECTOMY  1966   Family History  Problem Relation Age of Onset  . Heart disease Father     CABG  . Throat cancer Father   . Lung cancer Father   . Thyroid disease Mother   . Heart disease Maternal Grandmother   . Heart disease Paternal Grandfather     MI (died age 49)  . Colon cancer Neg Hx   . Prostate cancer Neg Hx    Social History   Social History  . Marital status: Married    Spouse name: N/A  . Number of children: N/A  . Years of education: N/A   Social History Main Topics  . Smoking status: Never Smoker  . Smokeless tobacco: Current User    Types: Chew  . Alcohol use No  . Drug use: No  . Sexual activity: Not Asked   Other Topics Concern  . None   Social History Narrative  . None    Outpatient Encounter Prescriptions as of 01/31/2016  Medication Sig  . omeprazole (PRILOSEC) 40 MG capsule TAKE 1 CAPSULE DAILY (PHYSICAL DUE IN MAY)  . [DISCONTINUED] mupirocin ointment (BACTROBAN) 2 % Place 1 application into the nose 2 (two) times daily.   No facility-administered encounter  medications on file as of 01/31/2016.     Review of Systems  Constitutional: Negative for appetite change and unexpected weight change.  HENT: Positive for congestion. Negative for sinus pressure.   Eyes: Negative for pain and visual disturbance.  Respiratory: Negative for cough, chest tightness and shortness of breath.   Cardiovascular: Negative for chest pain, palpitations and leg swelling.  Gastrointestinal: Negative for abdominal pain, diarrhea, nausea and vomiting.  Genitourinary: Negative for difficulty urinating and dysuria.  Musculoskeletal: Negative for back pain and joint swelling.  Skin: Negative for color change and rash.  Neurological: Negative for dizziness, light-headedness and headaches.  Hematological: Negative for adenopathy. Does not bruise/bleed easily.  Psychiatric/Behavioral: Negative for agitation and dysphoric mood.       Objective:     Blood pressure rechecked by me:  108/76  Physical Exam  Constitutional: He is oriented to person, place, and time. He appears well-developed and well-nourished. No distress.  HENT:  Head: Normocephalic and atraumatic.  Nose: Nose normal.  Mouth/Throat: Oropharynx is clear and moist. No oropharyngeal exudate.  Eyes: Conjunctivae are normal. Right eye exhibits no discharge. Left eye exhibits no discharge.  Neck: Neck supple. No thyromegaly present.  Cardiovascular:  Normal rate and regular rhythm.   Pulmonary/Chest: Breath sounds normal. No respiratory distress. He has no wheezes.  Abdominal: Soft. Bowel sounds are normal. There is no tenderness.  Genitourinary:  Genitourinary Comments: Rectal exam - no palpable prostate nodules. Heme negative.    Musculoskeletal: He exhibits no edema or tenderness.  Lymphadenopathy:    He has no cervical adenopathy.  Neurological: He is alert and oriented to person, place, and time.  Skin: Skin is warm and dry. No rash noted. No erythema.  Psychiatric: He has a normal mood and affect. His  behavior is normal.    BP 108/60   Pulse 68   Temp 97.8 F (36.6 C) (Oral)   Ht 6' (1.829 m)   Wt 171 lb (77.6 kg)   SpO2 97%   BMI 23.19 kg/m  Wt Readings from Last 3 Encounters:  01/31/16 171 lb (77.6 kg)  09/22/14 168 lb (76.2 kg)  02/18/14 171 lb 12 oz (77.9 kg)     Lab Results  Component Value Date   WBC 7.2 09/22/2014   HGB 15.0 09/22/2014   HCT 44.1 09/22/2014   PLT 228.0 09/22/2014   GLUCOSE 91 09/22/2014   CHOL 185 09/22/2014   TRIG 223.0 (H) 09/22/2014   HDL 35.30 (L) 09/22/2014   LDLDIRECT 86.0 09/22/2014   LDLCALC 147 (H) 03/05/2014   ALT 15 09/22/2014   AST 16 09/22/2014   NA 137 09/22/2014   K 4.4 09/22/2014   CL 101 09/22/2014   CREATININE 0.97 09/22/2014   BUN 12 09/22/2014   CO2 31 09/22/2014   TSH 0.67 09/22/2014   PSA 0.70 03/05/2014       Assessment & Plan:   Problem List Items Addressed This Visit    GERD (gastroesophageal reflux disease)    Controlled on omeprazole.        Health care maintenance    Physical today 01/31/16.  Check psa with labs.  Colonoscopy 2012.  States recommended f/u in 10 years.        Hematuria    Previously worked up by urology.  Had recommended f/u.  He declined.  Check urinalysis.        Relevant Orders   Urinalysis, Routine w reflex microscopic (not at [facility])   Hypercholesterolemia    Low cholesterol diet and exercise.  Follow lipid panel.        Relevant Orders   CBC with Differential/Platelet   Comprehensive metabolic panel   TSH   Lipid panel    Other Visit Diagnoses    Prostate cancer screening    -  Primary   Relevant Orders   PSA       Einar Pheasant, MD

## 2016-01-31 NOTE — Assessment & Plan Note (Signed)
Low cholesterol diet and exercise.  Follow lipid panel.   

## 2016-01-31 NOTE — Progress Notes (Signed)
Pre visit review using our clinic review tool, if applicable. No additional management support is needed unless otherwise documented below in the visit note. 

## 2016-02-04 ENCOUNTER — Other Ambulatory Visit: Payer: Self-pay | Admitting: Internal Medicine

## 2016-02-04 DIAGNOSIS — R319 Hematuria, unspecified: Secondary | ICD-10-CM

## 2016-02-06 ENCOUNTER — Telehealth: Payer: Self-pay | Admitting: Internal Medicine

## 2016-02-06 NOTE — Telephone Encounter (Signed)
Pt called returning your call. Thank you!  [phone]

## 2016-03-01 ENCOUNTER — Ambulatory Visit: Payer: Self-pay

## 2016-03-13 ENCOUNTER — Other Ambulatory Visit: Payer: Self-pay | Admitting: Urology

## 2016-03-13 ENCOUNTER — Ambulatory Visit (INDEPENDENT_AMBULATORY_CARE_PROVIDER_SITE_OTHER): Payer: BLUE CROSS/BLUE SHIELD | Admitting: Urology

## 2016-03-13 ENCOUNTER — Encounter: Payer: Self-pay | Admitting: Urology

## 2016-03-13 VITALS — BP 115/78 | HR 76 | Ht 72.0 in | Wt 178.2 lb

## 2016-03-13 DIAGNOSIS — R319 Hematuria, unspecified: Secondary | ICD-10-CM

## 2016-03-13 MED ORDER — LIDOCAINE HCL 2 % EX GEL
1.0000 "application " | Freq: Once | CUTANEOUS | Status: AC
  Administered 2016-03-13: 1 via URETHRAL

## 2016-03-13 MED ORDER — CIPROFLOXACIN HCL 500 MG PO TABS
500.0000 mg | ORAL_TABLET | Freq: Once | ORAL | Status: AC
  Administered 2016-03-13: 500 mg via ORAL

## 2016-03-13 NOTE — Progress Notes (Signed)
03/13/2016 3:38 PM   Howard Berry 08/06/1960 [id number]  Referring provider: Einar Pheasant, MD [id number] [place], [place] [address]  Chief Complaint  Patient presents with  . New Patient (Initial Visit)    hematuria    HPI: 55 year old male who presents today for further evaluation of microscopic hematuria. This was seen on the patient's routine anal physical exam. His urinalysis demonstrated 3-6 rbc's/hpf. The patient states that he has been evaluated for microscopic hematuria twice in the past. Further, he states that both his mother and his sister have also been noted to have microscopic hematuria and negative workups.  The patient denies any progressive voiding symptoms including frequency, urgency, or incontinence. He denies any dysuria or gross hematuria. He has a history of recurrent urinary tract infections. His obstructive voiding symptoms. He has no history of kidney stones and denies any flank pain, fevers, or chills.  The patient was seen by his primary care provider 4 weeks ago and had a PSA drawn which was 0.27. He had a negative rectal exam at the time.  PMH: Past Medical History:  Diagnosis Date  . GERD (gastroesophageal reflux disease)    EGD (2007) - erosive duodenitis, reflux esophagitis, multiple gastric polyps  required dilatation.  . Hypercholesterolemia   . Left axis deviation    noted on prior EKG  . Recurrent sinusitis     Surgical History: Past Surgical History:  Procedure Laterality Date  . CHOLECYSTECTOMY  10/02/12   emergency surgery  . [place]  . TONSILLECTOMY  1966    Home Medications:    Medication List       Accurate as of 03/13/16  3:38 PM. Always use your most recent med list.          omeprazole 40 MG capsule Commonly known as:  PRILOSEC TAKE 1 CAPSULE DAILY (PHYSICAL DUE IN MAY)       Allergies:  Allergies  Allergen Reactions  . Augmentin [Amoxicillin-Pot Clavulanate]   .  Chlor-Trimeton [Chlorpheniramine Maleate]   . Penicillins     Family History: Family History  Problem Relation Age of Onset  . Heart disease Father     CABG  . Throat cancer Father   . Lung cancer Father   . Thyroid disease Mother   . Hematuria Mother   . Heart disease Maternal Grandmother   . Heart disease Paternal Grandfather     MI (died age 69)  . Colon cancer Neg Hx   . Prostate cancer Neg Hx     Social History:  reports that he has never smoked. His smokeless tobacco use includes Chew. He reports that he does not drink alcohol or use drugs.  ROS: UROLOGY Frequent Urination?: No Hard to postpone urination?: No Burning/pain with urination?: No Get up at night to urinate?: No Leakage of urine?: No Urine stream starts and stops?: No Trouble starting stream?: No Do you have to strain to urinate?: No Blood in urine?: Yes Urinary tract infection?: No Sexually transmitted disease?: No Injury to kidneys or bladder?: No Painful intercourse?: No Weak stream?: No Erection problems?: Yes Penile pain?: No  Gastrointestinal Nausea?: No Vomiting?: No Indigestion/heartburn?: No Diarrhea?: No Constipation?: No  Constitutional Fever: No Night sweats?: No Weight loss?: No Fatigue?: No  Skin Skin rash/lesions?: No Itching?: No  Eyes Blurred vision?: No Double vision?: No  Ears/Nose/Throat Sore throat?: No Sinus problems?: Yes  Hematologic/Lymphatic Swollen glands?: No Easy bruising?: No  Cardiovascular Leg swelling?: No Chest pain?:  No  Respiratory Cough?: No Shortness of breath?: No  Endocrine Excessive thirst?: No  Musculoskeletal Back pain?: No Joint pain?: No  Neurological Headaches?: No Dizziness?: No  Psychologic Depression?: No Anxiety?: No  Physical Exam: BP 115/78   Pulse 76   Ht 6' (1.829 m)   Wt 80.8 kg (178 lb 3.2 oz)   BMI 24.17 kg/m   Constitutional:  Alert and oriented, No acute distress. HEENT: Ord AT, moist mucus  membranes.  Trachea midline, no masses. Cardiovascular: No clubbing, cyanosis, or edema. Respiratory: Normal respiratory effort, no increased work of breathing. GI: Abdomen is soft, nontender, nondistended, no abdominal masses GU: No CVA tenderness.  Skin: No rashes, bruises or suspicious lesions. Lymph: No cervical or inguinal adenopathy. Neurologic: Grossly intact, no focal deficits, moving all 4 extremities. Psychiatric: Normal mood and affect.  Laboratory Data: Lab Results  Component Value Date   WBC 7.3 01/31/2016   HGB 14.5 01/31/2016   HCT 42.5 01/31/2016   MCV 89.8 01/31/2016   PLT 230.0 01/31/2016    Lab Results  Component Value Date   CREATININE 0.97 01/31/2016    Lab Results  Component Value Date   PSA 0.27 01/31/2016   PSA 0.70 03/05/2014   PSA 0.29 05/27/2012    No results found for: TESTOSTERONE  No results found for: HGBA1C  Urinalysis    Component Value Date/Time   COLORURINE YELLOW 01/31/2016 [place] 01/31/2016 1007   LABSPEC 1.025 01/31/2016 1007   PHURINE 5.5 01/31/2016 1007   GLUCOSEU NEGATIVE 01/31/2016 1007   HGBUR TRACE-INTACT (A) 01/31/2016 1007   BILIRUBINUR NEGATIVE 01/31/2016 1007   KETONESUR NEGATIVE 01/31/2016 1007   UROBILINOGEN 0.2 01/31/2016 1007   NITRITE NEGATIVE 01/31/2016 1007   LEUKOCYTESUR NEGATIVE 01/31/2016 1007    Pertinent Imaging: I reviewed an ultrasound that was obtained in 2014 for the patient's right upper quadrant pain. This demonstrates normal-appearing kidneys with full parenchyma no hydronephrosis, stones, or renal masses.  Assessment & Plan:  The patient has microscopic hematuria without any other evidence of infection. This is a recurring issue for the patient as he is already been worked at for this twice in the past. I was able to review an ultrasound from 2014, although this is seemingly outdated.  1. Hematuria, unspecified type I discussed the etiology of his [name] scopic hematuria,  suspect this is a benign familial process area down her, I spoke with the patient about this and we discussed the low likelihood of something significant. However, told that I could not be 100% confident that this was nothing. If he is willing to accept this risk that we may be missing a bladder cancer although unlikely, that I would be okay with him declining the workup. However, the patient has opted to proceed with cystoscopic evaluation to ensure no significant prostate or bladder pathology. - Urinalysis, Complete   No Follow-up on file.  Ardis Hughs, [facility] [address], [place], [place] [address] [phone]

## 2016-03-13 NOTE — Progress Notes (Signed)
   03/13/16  CC:  Chief Complaint  Patient presents with  . New Patient (Initial Visit)    hematuria    HPI:  Blood pressure 115/78, pulse 76, height 6' (1.829 m), weight 80.8 kg (178 lb 3.2 oz). NED. A&Ox3.   No respiratory distress   Abd soft, NT, ND Normal phallus with bilateral descended testicles  Cystoscopy Procedure Note  Patient identification was confirmed, informed consent was obtained, and patient was prepped using Betadine solution.  Lidocaine jelly was administered per urethral meatus.    Preoperative abx where received prior to procedure.     Pre-Procedure: - Inspection reveals a normal caliber ureteral meatus.  Procedure: The flexible cystoscope was introduced without difficulty - No urethral strictures/lesions are present. - Enlarged prostate  - Normal bladder neck - Bilateral ureteral orifices identified - Bladder mucosa  reveals no ulcers. There was a very small papillary lesion in the middle aspect of the trigone. This area was biopsied. The biopsy was very small and may be of low yield. There were no other abnormalities within the bladder. - No bladder stones - No trabeculation  Retroflexion shows no intravesical prostatic protrusion.   Post-Procedure: - Patient tolerated the procedure well  Assessment/ Plan:  Given the small area of the trigone of the patient's bladder, I sent this area to pathology to see if there is any [place] to get a diagnosis. It may be too small for any significant evaluation. However, I do think the patient should follow up with me in 6 months for repeat cystoscopy to ensure that there is no other new gross and that the area biopsied does not recur.

## 2016-03-17 LAB — PATHOLOGY REPORT

## 2016-03-21 ENCOUNTER — Telehealth: Payer: Self-pay

## 2016-03-21 NOTE — Telephone Encounter (Signed)
-----   Message from Ardis Hughs, MD sent at 03/20/2016  7:35 PM EST ----- Please let the patient know that the bladder biopsy was nondiagnostic. We will plan to repeat the test again in 6 months. This is been scheduled.

## 2016-03-21 NOTE — Telephone Encounter (Signed)
Attempted to contact the pt, no answer. LMOM to return my  Call.

## 2016-03-22 NOTE — Telephone Encounter (Signed)
Attempted to contact pt, no answer. LMOM 

## 2016-03-23 NOTE — Telephone Encounter (Signed)
Pt notified, no questions or concerns.

## 2016-03-26 ENCOUNTER — Other Ambulatory Visit: Payer: Self-pay | Admitting: Urology

## 2016-04-24 ENCOUNTER — Other Ambulatory Visit: Payer: Self-pay | Admitting: Internal Medicine

## 2016-07-03 ENCOUNTER — Ambulatory Visit (INDEPENDENT_AMBULATORY_CARE_PROVIDER_SITE_OTHER): Payer: BLUE CROSS/BLUE SHIELD

## 2016-07-03 ENCOUNTER — Encounter: Payer: Self-pay | Admitting: Internal Medicine

## 2016-07-03 ENCOUNTER — Ambulatory Visit (INDEPENDENT_AMBULATORY_CARE_PROVIDER_SITE_OTHER): Payer: BLUE CROSS/BLUE SHIELD | Admitting: Internal Medicine

## 2016-07-03 VITALS — BP 118/62 | HR 61 | Temp 98.6°F | Resp 16 | Ht 72.0 in | Wt 180.4 lb

## 2016-07-03 DIAGNOSIS — R079 Chest pain, unspecified: Secondary | ICD-10-CM

## 2016-07-03 DIAGNOSIS — K219 Gastro-esophageal reflux disease without esophagitis: Secondary | ICD-10-CM | POA: Diagnosis not present

## 2016-07-03 DIAGNOSIS — E7800 Pure hypercholesterolemia, unspecified: Secondary | ICD-10-CM | POA: Diagnosis not present

## 2016-07-03 DIAGNOSIS — Z125 Encounter for screening for malignant neoplasm of prostate: Secondary | ICD-10-CM

## 2016-07-03 DIAGNOSIS — R319 Hematuria, unspecified: Secondary | ICD-10-CM | POA: Diagnosis not present

## 2016-07-03 NOTE — Progress Notes (Signed)
Patient ID: Howard Berry, male   DOB: 1960-06-01, 56 y.o.   MRN: [id number]   Subjective:    Patient ID: Howard Berry, male    DOB: 29-Aug-1960, 56 y.o.   MRN: [id number]  HPI  Patient here for a scheduled follow up.  He reports that approximately 10 days ago, his face felt hot.  No headache. Went on to work.  Had a meeting.  States during the meeting he felt similar symptoms.  Had to go to the bathroom.  Had diarrhea.  Felt ok after bowel movement.  A few hours later, had similar sensation.  Felt weak and had some nausea.  Another episode of diarrhea.  Some decreased appetite.  Minimal light headedness.  Lasted a couple of days.  He felt like his blood pressure was elevated.  Feels better today.  He has noticed some chest discomfort.  Some soreness with palpation.  Given symptoms, he states he is concerned about his blood pressure and wants his heart checked.     Past Medical History:  Diagnosis Date  . GERD (gastroesophageal reflux disease)    EGD (2007) - erosive duodenitis, reflux esophagitis, multiple gastric polyps  required dilatation.  . Hypercholesterolemia   . Left axis deviation    noted on prior EKG  . Recurrent sinusitis    Past Surgical History:  Procedure Laterality Date  . CHOLECYSTECTOMY  10/02/12   emergency surgery  . [place]  . TONSILLECTOMY  1966   Family History  Problem Relation Age of Onset  . Heart disease Father     CABG  . Throat cancer Father   . Lung cancer Father   . Thyroid disease Mother   . Hematuria Mother   . Heart disease Maternal Grandmother   . Heart disease Paternal Grandfather     MI (died age 68)  . Colon cancer Neg Hx   . Prostate cancer Neg Hx    Social History   Social History  . Marital status: Married    Spouse name: N/A  . Number of children: N/A  . Years of education: N/A   Social History Main Topics  . Smoking status: Never Smoker  . Smokeless tobacco: Current User    Types: Chew  . Alcohol use No  . Drug  use: No  . Sexual activity: Not Asked   Other Topics Concern  . None   Social History Narrative  . None    Outpatient Encounter Prescriptions as of 07/03/2016  Medication Sig  . omeprazole (PRILOSEC) 40 MG capsule TAKE 1 CAPSULE DAILY (PHYSICAL DUE IN MAY)   No facility-administered encounter medications on file as of 07/03/2016.     Review of Systems  Constitutional: Negative for unexpected weight change.       Some previous decreased appetite.    HENT: Negative for congestion and sinus pressure.   Respiratory: Negative for cough and shortness of breath.   Cardiovascular: Positive for chest pain. Negative for palpitations and leg swelling.  Gastrointestinal: Negative for abdominal pain and vomiting.       Previous diarrhea.  Resolved.    Genitourinary: Negative for difficulty urinating and dysuria.  Musculoskeletal: Negative for back pain and joint swelling.  Skin: Negative for color change and rash.  Neurological: Negative for headaches.       Previous light headedness.    Psychiatric/Behavioral: Negative for agitation and dysphoric mood.       Objective:    Physical Exam  Constitutional: He appears  well-developed and well-nourished. No distress.  HENT:  Nose: Nose normal.  Mouth/Throat: Oropharynx is clear and moist.  Neck: Neck supple. No thyromegaly present.  Cardiovascular: Normal rate and regular rhythm.   Pulmonary/Chest: Effort normal and breath sounds normal. No respiratory distress.  Abdominal: Soft. Bowel sounds are normal. There is no tenderness.  Musculoskeletal: He exhibits no edema or tenderness.  Some tenderness to palpation over the anterior chest.  Lymphadenopathy:    He has no cervical adenopathy.  Skin: No rash noted. No erythema.  Psychiatric: He has a normal mood and affect. His behavior is normal.    BP 118/62 (BP Location: Left Arm, Patient Position: Sitting, Cuff Size: Large)   Pulse 61   Temp 98.6 F (37 C) (Oral)   Resp 16   Ht 6'  (1.829 m)   Wt 180 lb 6.4 oz (81.8 kg)   SpO2 96%   BMI 24.47 kg/m  Wt Readings from Last 3 Encounters:  07/03/16 180 lb 6.4 oz (81.8 kg)  03/13/16 178 lb 3.2 oz (80.8 kg)  01/31/16 171 lb (77.6 kg)     Lab Results  Component Value Date   WBC 7.3 01/31/2016   HGB 14.5 01/31/2016   HCT 42.5 01/31/2016   PLT 230.0 01/31/2016   GLUCOSE 86 01/31/2016   CHOL 168 01/31/2016   TRIG 148.0 01/31/2016   HDL 37.10 (L) 01/31/2016   LDLDIRECT 86.0 09/22/2014   LDLCALC 101 (H) 01/31/2016   ALT 21 01/31/2016   AST 15 01/31/2016   NA 141 01/31/2016   K 4.5 01/31/2016   CL 102 01/31/2016   CREATININE 0.97 01/31/2016   BUN 15 01/31/2016   CO2 35 (H) 01/31/2016   TSH 0.65 01/31/2016   PSA 0.27 01/31/2016       Assessment & Plan:   Problem List Items Addressed This Visit    Chest pain - Primary    Had the episode as outlined.  Felt hot.  Felt blood pressure elevated.  Had some associated nausea and loose stool.  GI symptoms resolved.  Felt light headed.  Some chest pain.  Some reproducible pain on exam.  Cannot completely say this is the pain he has felt.  Given symptoms, EKG - SR with TWI in III and some flattening in v5 and v6.  No acute ischemic changes  Will check cxr.  Also discussed referral to cardiology for further evaluation and question of need for stress test.        Relevant Orders   EKG 12-Lead (Completed)   DG Chest 2 View (Completed)   Ambulatory referral to Cardiology   GERD (gastroesophageal reflux disease)    Controlled on omeprazole.        Hematuria    Saw urology.  S/p biopsy.  Recommended f/u in 6 months.  See note.        Relevant Orders   Comprehensive metabolic panel   Hypercholesterolemia    Low cholesterol diet and exercise.  Follow lipid panel.        Relevant Orders   Lipid panel   CBC with Differential/Platelet   TSH    Other Visit Diagnoses    Prostate cancer screening       Relevant Orders   PSA       Einar Pheasant, MD

## 2016-07-03 NOTE — Progress Notes (Signed)
Pre-visit discussion using our clinic review tool. No additional management support is needed unless otherwise documented below in the visit note.  

## 2016-07-04 ENCOUNTER — Telehealth: Payer: Self-pay

## 2016-07-04 NOTE — Telephone Encounter (Signed)
Pt called back he is ok to be sent for referral to what ever cardiologist  you would like him to see.

## 2016-07-04 NOTE — Telephone Encounter (Signed)
-----   Message from Einar Pheasant, MD sent at 07/04/2016  5:33 AM EST ----- Notify pt that cxr is ok.  Is he agreeable to cardiology referral now?  If so, I will place the order for the referral.

## 2016-07-04 NOTE — Telephone Encounter (Signed)
lmtrc

## 2016-07-09 ENCOUNTER — Encounter: Payer: Self-pay | Admitting: Internal Medicine

## 2016-07-09 NOTE — Assessment & Plan Note (Addendum)
Had the episode as outlined.  Felt hot.  Felt blood pressure elevated.  Had some associated nausea and loose stool.  GI symptoms resolved.  Felt light headed.  Some chest pain.  Some reproducible pain on exam.  Cannot completely say this is the pain he has felt.  Given symptoms, EKG - SR with TWI in III and some flattening in v5 and v6.  No acute ischemic changes  Will check cxr.  Also discussed referral to cardiology for further evaluation and question of need for stress test.

## 2016-07-09 NOTE — Telephone Encounter (Signed)
Order placed for cardiology referral.   

## 2016-07-09 NOTE — Assessment & Plan Note (Signed)
Saw urology.  S/p biopsy.  Recommended f/u in 6 months.  See note.

## 2016-07-09 NOTE — Assessment & Plan Note (Signed)
Low cholesterol diet and exercise.  Follow lipid panel.   

## 2016-07-09 NOTE — Assessment & Plan Note (Signed)
Controlled on omeprazole.   

## 2016-07-10 ENCOUNTER — Encounter: Payer: Self-pay | Admitting: Internal Medicine

## 2016-07-10 ENCOUNTER — Ambulatory Visit (INDEPENDENT_AMBULATORY_CARE_PROVIDER_SITE_OTHER): Payer: BLUE CROSS/BLUE SHIELD | Admitting: Internal Medicine

## 2016-07-10 VITALS — BP 118/72 | HR 87 | Ht 72.0 in | Wt 180.0 lb

## 2016-07-10 DIAGNOSIS — R0789 Other chest pain: Secondary | ICD-10-CM

## 2016-07-10 DIAGNOSIS — R5381 Other malaise: Secondary | ICD-10-CM | POA: Diagnosis not present

## 2016-07-10 DIAGNOSIS — R42 Dizziness and giddiness: Secondary | ICD-10-CM

## 2016-07-10 NOTE — Progress Notes (Signed)
New Outpatient Visit Date: 07/10/2016  Referring Provider: Einar Pheasant, MD [id number] [place], [place] [address]  Chief Complaint: Chest pain and elevated blood pressure  HPI:  [name] is a 56 y.o. year-old male with history of hyperlipidemia and GERD, who has been referred by Dr. Nicki Reaper for evaluation of chest pain. The patient reports several years of intermittent episodes in which she feels as though his blood pressure "shoots up." It should be noted, however, that he has never actually checked his blood pressure during one of the episodes nor does he carry a diagnosis of hypertension. The episode is often associated with a sensation of warmth involving the face. Sensation occurs sporadically, sometimes happening only once every few weeks. It typically lasts for a few minutes before resolving on its own. About 2 weeks ago, he had a similar episode, though it was also accompanied by nausea and lightheadedness. After the episode began to improve, the patient noted a "soreness" along the lateral margin of the left sternum. It was tender to palpation, lasting several days. He continued to have intermittent lightheadedness for the next 3 days before returning back to his baseline.  Today, the patient is asymptomatic. He has not had any other chest pain or shortness of breath at other times. He also denies orthopnea, PND, palpitations, leg edema, and claudication. 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 He was also able to walk for an extended period of time while deer hunting on New Year's Eve without any difficulty. He reports having undergone at least 2 previous stress tests as recently as 4-5 years ago. He believes both were normal. He does not have a history of cardiovascular disease.  --------------------------------------------------------------------------------------------------  Cardiovascular History & Procedures: Cardiovascular  Problems:  Atypical chest pain  Risk Factors:  Hyperlipidemia, male gender, and age greater than 87  Cath/PCI:  None  CV Surgery:  None  EP Procedures and Devices:  None  Non-Invasive Evaluation(s):  Stress test (at least 4-5 years ago): Negative per patient report.  Recent CV Pertinent Labs: Lab Results  Component Value Date   CHOL 168 01/31/2016   CHOL 174 12/17/2013   HDL 37.10 (L) 01/31/2016   HDL 38 (L) 12/17/2013   LDLCALC 101 (H) 01/31/2016   LDLCALC 107 (H) 12/17/2013   LDLDIRECT 86.0 09/22/2014   TRIG 148.0 01/31/2016   TRIG 147 12/17/2013   CHOLHDL 5 01/31/2016   K 4.5 01/31/2016   K 4.3 12/17/2013   BUN 15 01/31/2016   BUN 17 12/17/2013   CREATININE 0.97 01/31/2016   CREATININE 1.13 12/17/2013    --------------------------------------------------------------------------------------------------  Past Medical History:  Diagnosis Date  . GERD (gastroesophageal reflux disease)    EGD (2007) - erosive duodenitis, reflux esophagitis, multiple gastric polyps  required dilatation.  . Hypercholesterolemia   . Left axis deviation    noted on prior EKG  . Recurrent sinusitis     Past Surgical History:  Procedure Laterality Date  . CHOLECYSTECTOMY  10/02/12   emergency surgery  . [place]  . TONSILLECTOMY  1966    Outpatient Encounter Prescriptions as of 07/10/2016  Medication Sig  . omeprazole (PRILOSEC) 40 MG capsule TAKE 1 CAPSULE DAILY (PHYSICAL DUE IN MAY)   No facility-administered encounter medications on file as of 07/10/2016.     Allergies: Augmentin [amoxicillin-pot clavulanate]; Chlor-trimeton [chlorpheniramine maleate]; and Penicillins  Social History   Social History  . Marital status: Married    Spouse  name: N/A  . Number of children: N/A  . Years of education: N/A   Occupational History  . Not on file.   Social History Main Topics  . Smoking status: Never Smoker  . Smokeless tobacco: Current User    Types:  Chew  . Alcohol use No  . Drug use: No  . Sexual activity: Not on file   Other Topics Concern  . Not on file   Social History Narrative  . No narrative on file    Family History  Problem Relation Age of Onset  . Heart disease Father   . Throat cancer Father   . Lung cancer Father   . Thyroid disease Mother   . Hematuria Mother   . Heart disease Maternal Grandmother   . Heart disease Paternal Grandfather     MI (died age 89)  . Colon cancer Neg Hx   . Prostate cancer Neg Hx     Review of Systems: A 12-system review of systems was performed and was negative except as noted in the HPI.  --------------------------------------------------------------------------------------------------  Physical Exam: BP 118/72 (BP Location: Right Arm, Patient Position: Sitting, Cuff Size: Normal)   Pulse 87   Ht 6' (1.829 m)   Wt 180 lb (81.6 kg)   BMI 24.41 kg/m   General:  Well-developed, well-nourished man seated comfortably in the exam room. HEENT: No conjunctival pallor or scleral icterus.  Moist mucous membranes.  OP clear. Neck: Supple without lymphadenopathy, thyromegaly, JVD, or HJR.  No carotid bruit. Lungs: Normal work of breathing.  Clear to auscultation bilaterally without wheezes or crackles. Heart: Regular rate and rhythm without murmurs, rubs, or gallops.  Non-displaced PMI. Abd: Bowel sounds present.  Soft, NT/ND without hepatosplenomegaly Ext: No lower extremity edema.  Radial, PT, and DP pulses are 2+ bilaterally Skin: warm and dry without rash Neuro: CNIII-XII intact.  Strength and fine-touch sensation intact in upper and lower extremities bilaterally. Psych: Normal mood and affect. Chest wall: No anterior chest wall tenderness to palpation or deformity.  EKG:  Normal sinus rhythm with left anterior fascicular block, unchanged from prior tracing on 07/03/16 (I have personally reviewed both tracings).  Lab Results  Component Value Date   WBC 7.3 01/31/2016   HGB  14.5 01/31/2016   HCT 42.5 01/31/2016   MCV 89.8 01/31/2016   PLT 230.0 01/31/2016    Lab Results  Component Value Date   NA 141 01/31/2016   K 4.5 01/31/2016   CL 102 01/31/2016   CO2 35 (H) 01/31/2016   BUN 15 01/31/2016   CREATININE 0.97 01/31/2016   GLUCOSE 86 01/31/2016   ALT 21 01/31/2016    Lab Results  Component Value Date   CHOL 168 01/31/2016   HDL 37.10 (L) 01/31/2016   LDLCALC 101 (H) 01/31/2016   LDLDIRECT 86.0 09/22/2014   TRIG 148.0 01/31/2016   CHOLHDL 5 01/31/2016    --------------------------------------------------------------------------------------------------  ASSESSMENT AND PLAN: Atypical chest pain Quality of patient's chest pain is atypical. His cardiac risk factors include hyperlipidemia, age, and male gender. His father has a history of heart disease, though it is not clear by the history that this represents premature atherosclerotic cardiovascular disease. We have discussed further evaluation options and have agreed to obtain an exercise tolerance test. EKG today is normal with the exception of left anterior fascicular block, which is stable. Patient should continue omeprazole, given his history of GERD. I encouraged him to stop using smokeless tobacco.  Lightheadedness and malaise Patient believes that some  of his episodes may be due to elevated blood pressure. However, he has never actually checked his blood pressure at the time of his symptoms. He is normotensive today. I counseled him that lightheadedness could actually be due to low blood pressure. I encouraged him to use his home blood pressure cuff if he has future episodes to better understand his blood pressure at the time of symptoms. Given that he does not have any palpitations, I do not believe that event monitor or Holter monitor is worthwhile at this time.  Follow-up: Return to clinic in 4-6 weeks.  Nelva Bush, MD 07/11/2016 12:51 PM

## 2016-07-10 NOTE — Patient Instructions (Signed)
Medication Instructions:  Your physician recommends that you continue on your current medications as directed. Please refer to the Current Medication list given to you today.   Labwork: none  Testing/Procedures: Your physician has requested that you have an exercise tolerance test. For further information please visit [email or web address]. Please also follow instruction sheet, as given.    Follow-Up: Your physician recommends that you schedule a follow-up appointment in: [place].   If you need a refill on your cardiac medications before your next appointment, please call your pharmacy.   Exercise Stress Electrocardiogram An exercise stress electrocardiogram is a test to check how blood flows to your heart. It is done to find areas of poor blood flow. You will need to walk on a treadmill for this test. The electrocardiogram will record your heartbeat when you are at rest and when you are exercising. What happens before the procedure?  Do not have drinks with caffeine or foods with caffeine for 24 hours before the test, or as told by your doctor. This includes coffee, tea (even decaf tea), sodas, chocolate, and cocoa.  Follow your doctor's instructions about eating and drinking before the test.  Ask your doctor what medicines you should or should not take before the test. Take your medicines with water unless told by your doctor not to.  If you use an inhaler, bring it with you to the test.  Bring a snack to eat after the test.  Do not  smoke for 4 hours before the test.  Do not put lotions, powders, creams, or oils on your chest before the test.  Wear comfortable shoes and clothing. What happens during the procedure?  You will have patches put on your chest. Small areas of your chest may need to be shaved. Wires will be connected to the patches.  Your heart rate will be watched while you are resting and while you are exercising.  You will walk on the treadmill.  The treadmill will slowly get faster to raise your heart rate.  The test will take about 1-2 hours. What happens after the procedure?  Your heart rate and blood pressure will be watched after the test.  You may return to your normal diet, activities, and medicines or as told by your doctor. This information is not intended to replace advice given to you by your health care provider. Make sure you discuss any questions you have with your health care provider. Document Released: 10/10/2007 Document Revised: 12/21/2015 Document Reviewed: 12/29/2012 Elsevier Interactive Patient Education  2017 [employer].

## 2016-07-11 ENCOUNTER — Telehealth: Payer: Self-pay

## 2016-07-11 ENCOUNTER — Other Ambulatory Visit (INDEPENDENT_AMBULATORY_CARE_PROVIDER_SITE_OTHER): Payer: BLUE CROSS/BLUE SHIELD

## 2016-07-11 DIAGNOSIS — R7989 Other specified abnormal findings of blood chemistry: Secondary | ICD-10-CM

## 2016-07-11 DIAGNOSIS — Z125 Encounter for screening for malignant neoplasm of prostate: Secondary | ICD-10-CM

## 2016-07-11 DIAGNOSIS — R319 Hematuria, unspecified: Secondary | ICD-10-CM

## 2016-07-11 DIAGNOSIS — E7800 Pure hypercholesterolemia, unspecified: Secondary | ICD-10-CM

## 2016-07-11 LAB — COMPREHENSIVE METABOLIC PANEL
ALBUMIN: 4.4 g/dL (ref 3.5–5.2)
ALK PHOS: 63 U/L (ref 39–117)
ALT: 20 U/L (ref 0–53)
AST: 15 U/L (ref 0–37)
BILIRUBIN TOTAL: 0.5 mg/dL (ref 0.2–1.2)
BUN: 15 mg/dL (ref 6–23)
CALCIUM: 9.5 mg/dL (ref 8.4–10.5)
CO2: 32 mEq/L (ref 19–32)
Chloride: 102 mEq/L (ref 96–112)
Creatinine, Ser: 1.03 mg/dL (ref 0.40–1.50)
GFR: 79.3 mL/min (ref >60.00)
Glucose, Bld: 96 mg/dL (ref 70–99)
Potassium: 4.7 mEq/L (ref 3.5–5.1)
Sodium: 140 mEq/L (ref 135–145)
TOTAL PROTEIN: 7.3 g/dL (ref 6.0–8.3)

## 2016-07-11 LAB — CBC WITH DIFFERENTIAL/PLATELET
BASOS ABS: 0.1 10*3/uL (ref 0.0–0.1)
Basophils Relative: 0.9 % (ref 0.0–3.0)
Eosinophils Absolute: 0.2 10*3/uL (ref 0.0–0.7)
Eosinophils Relative: 3.6 % (ref 0.0–5.0)
HEMATOCRIT: 44.2 % (ref 39.0–52.0)
HEMOGLOBIN: 15.1 g/dL (ref 13.0–17.0)
LYMPHS PCT: 30.4 % (ref 12.0–46.0)
Lymphs Abs: 2 10*3/uL (ref 0.7–4.0)
MCHC: 34.2 g/dL (ref 30.0–36.0)
MCV: 90.3 fl (ref 78.0–100.0)
MONOS PCT: 8.9 % (ref 3.0–12.0)
Monocytes Absolute: 0.6 10*3/uL (ref 0.1–1.0)
NEUTROS ABS: 3.7 10*3/uL (ref 1.4–7.7)
Neutrophils Relative %: 56.2 % (ref 43.0–77.0)
Platelets: 226 10*3/uL (ref 150.0–400.0)
RBC: 4.9 Mil/uL (ref 4.22–5.81)
RDW: 12.8 % (ref 11.5–15.5)
WBC: 6.5 10*3/uL (ref 4.0–10.5)

## 2016-07-11 LAB — LIPID PANEL
CHOL/HDL RATIO: 5
CHOLESTEROL: 195 mg/dL (ref 0–200)
HDL: 36.6 mg/dL — ABNORMAL LOW (ref >39.00)
NonHDL: 157.96
Triglycerides: 221 mg/dL — ABNORMAL HIGH (ref 0.0–149.0)
VLDL: 44.2 mg/dL — ABNORMAL HIGH (ref 0.0–40.0)

## 2016-07-11 LAB — TSH: TSH: 0.76 u[IU]/mL (ref 0.35–4.50)

## 2016-07-11 LAB — PSA: PSA: 0.38 ng/mL (ref 0.10–4.00)

## 2016-07-11 LAB — LDL CHOLESTEROL, DIRECT: LDL DIRECT: 89 mg/dL

## 2016-07-11 NOTE — Telephone Encounter (Signed)
-----   Message from Einar Pheasant, MD sent at 07/11/2016  3:01 PM EST ----- Notify pt that his triglycerides are elevated.  Bad cholesterol ok.  Low carb diet.  We will follow.  Send copy of [facility] Lipid diet.  Thyroid test, hgb, psa, kidney function tests and liver function tests are wnl.

## 2016-07-11 NOTE — Telephone Encounter (Signed)
lmtrc

## 2016-07-12 ENCOUNTER — Telehealth: Payer: Self-pay | Admitting: *Deleted

## 2016-07-12 NOTE — Telephone Encounter (Signed)
Left message to return call to our office.  

## 2016-07-12 NOTE — Telephone Encounter (Signed)
Spoke with pt and informed him of his lab results. Mailed pt a copy of the [facility] lipid diet. Pt gave a verbal understanding.

## 2016-07-12 NOTE — Telephone Encounter (Signed)
Pt requested a return call [phone].

## 2016-07-12 NOTE — Telephone Encounter (Signed)
Patient requested lab/test results Pt contact [phone]

## 2016-07-13 NOTE — Telephone Encounter (Signed)
See other message taken care of by JW

## 2016-07-23 ENCOUNTER — Ambulatory Visit (INDEPENDENT_AMBULATORY_CARE_PROVIDER_SITE_OTHER): Payer: BLUE CROSS/BLUE SHIELD

## 2016-07-23 DIAGNOSIS — R0789 Other chest pain: Secondary | ICD-10-CM | POA: Diagnosis not present

## 2016-07-24 LAB — EXERCISE TOLERANCE TEST
CSEPED: 8 min
CSEPEDS: 53 s
CSEPEW: 10.1 METS
MPHR: 164 {beats}/min
Peak HR: 166 {beats}/min
Percent HR: 101 %
Rest HR: 105 {beats}/min

## 2016-08-14 ENCOUNTER — Ambulatory Visit: Payer: BLUE CROSS/BLUE SHIELD | Admitting: Internal Medicine

## 2016-08-21 ENCOUNTER — Encounter: Payer: Self-pay | Admitting: Internal Medicine

## 2016-08-21 ENCOUNTER — Ambulatory Visit (INDEPENDENT_AMBULATORY_CARE_PROVIDER_SITE_OTHER): Payer: BLUE CROSS/BLUE SHIELD | Admitting: Internal Medicine

## 2016-08-21 VITALS — BP 120/78 | HR 87 | Ht 72.0 in | Wt 178.5 lb

## 2016-08-21 DIAGNOSIS — R0789 Other chest pain: Secondary | ICD-10-CM | POA: Diagnosis not present

## 2016-08-21 NOTE — Patient Instructions (Signed)
Medication Instructions:  Your physician recommends that you continue on your current medications as directed. Please refer to the Current Medication list given to you today.   Labwork: none  Testing/Procedures: none  Follow-Up: Your physician recommends that you schedule a follow-up appointment ON AN AS NEEDED BASIS.   

## 2016-08-21 NOTE — Progress Notes (Signed)
Follow-up Outpatient Visit Date: 08/21/2016  Primary Care Provider: Einar Pheasant, [facility] [address] [place] [address]  Chief Complaint: Follow-up atypical chest pain  HPI:  Howard Berry is a 56 y.o. year-old male with history of hyperlipidemia and GERD, who presents for follow-up of atypical chest pain. I first met him on 07/10/16, which time he described "soreness" along the lateral margin of his left sternum. He also reported subjective blood pressure changes with occasional lightheadedness. Evaluation at that time was unrevealing. We agreed to perform an exercise tolerance test, which was low risk. The patient demonstrated good exercise capacity without limitations. Since completing the ETT, he has not had any further chest pain. He also denies shortness of breath, palpitations, lightheadedness, orthopnea, PND, and leg edema. He has not checked his blood pressure at home but feels as though it has been relatively well controlled. He is planning to start exercising more regularly, now that the weather is warmer.  --------------------------------------------------------------------------------------------------  Cardiovascular History & Procedures: Cardiovascular Problems:  Atypical chest pain  Risk Factors:  Hyperlipidemia, male gender, and age greater than 35  Cath/PCI:  None  CV Surgery:  None  EP Procedures and Devices:  None  Non-Invasive Evaluation(s):  ETT (07/23/16): Low risk study with no significant ST depression during exercise. Nonspecific ST/T changes noted during recovery. Rare PACs during stress. No sustained arrhythmias. Good exercise capacity (8 minutes, 53 seconds; 10.1 metastases), achieving 166 bpm (101% MPHR).  Recent CV Pertinent Labs: Lab Results  Component Value Date   CHOL 195 07/11/2016   CHOL 174 12/17/2013   HDL 36.60 (L) 07/11/2016   HDL 38 (L) 12/17/2013   LDLCALC 101 (H) 01/31/2016   LDLCALC 107 (H) 12/17/2013   LDLDIRECT 89.0 07/11/2016   TRIG 221.0 (H) 07/11/2016   TRIG 147 12/17/2013   CHOLHDL 5 07/11/2016   K 4.7 07/11/2016   K 4.3 12/17/2013   BUN 15 07/11/2016   BUN 17 12/17/2013   CREATININE 1.03 07/11/2016   CREATININE 1.13 12/17/2013    Past medical and surgical history were reviewed and updated in EPIC.  Outpatient Encounter Prescriptions as of 08/21/2016  Medication Sig  . omeprazole (PRILOSEC) 40 MG capsule TAKE 1 CAPSULE DAILY (PHYSICAL DUE IN MAY)   No facility-administered encounter medications on file as of 08/21/2016.     Allergies: Augmentin [amoxicillin-pot clavulanate]; Chlor-trimeton [chlorpheniramine maleate]; and Penicillins  Social History   Social History  . Marital status: Married    Spouse name: N/A  . Number of children: N/A  . Years of education: N/A   Occupational History  . Not on file.   Social History Main Topics  . Smoking status: Never Smoker  . Smokeless tobacco: Current User    Types: Chew  . Alcohol use No  . Drug use: No  . Sexual activity: Not on file   Other Topics Concern  . Not on file   Social History Narrative  . No narrative on file    Family History  Problem Relation Age of Onset  . Heart disease Father   . Throat cancer Father   . Lung cancer Father   . Thyroid disease Mother   . Hematuria Mother   . Heart disease Maternal Grandmother   . Heart disease Paternal Grandfather     MI (died age 49)  . Colon cancer Neg Hx   . Prostate cancer Neg Hx     Review of Systems: A 12-system review of systems was performed and was negative  except as noted in the HPI.  --------------------------------------------------------------------------------------------------  Physical Exam: BP 120/78 (BP Location: Left Arm, Patient Position: Sitting, Cuff Size: Normal)   Pulse 87   Ht 6' (1.829 m)   Wt 178 lb 8 oz (81 kg)   BMI 24.21 kg/m   General:  Well-developed, well-nourished man, seated comfortably in the exam  room. HEENT: No conjunctival pallor or scleral icterus.  Moist mucous membranes.  OP clear. Neck: Supple without lymphadenopathy, thyromegaly, JVD, or HJR. Lungs: Normal work of breathing.  Clear to auscultation bilaterally without wheezes or crackles. Heart: Regular rate and rhythm without murmurs, rubs, or gallops.  Non-displaced PMI. Abd: Bowel sounds present.  Soft, NT/ND without hepatosplenomegaly Ext: No lower extremity edema.  Radial, PT, and DP pulses are 2+ bilaterally. Skin: warm and dry without rash  Lab Results  Component Value Date   WBC 6.5 07/11/2016   HGB 15.1 07/11/2016   HCT 44.2 07/11/2016   MCV 90.3 07/11/2016   PLT 226.0 07/11/2016    Lab Results  Component Value Date   NA 140 07/11/2016   K 4.7 07/11/2016   CL 102 07/11/2016   CO2 32 07/11/2016   BUN 15 07/11/2016   CREATININE 1.03 07/11/2016   GLUCOSE 96 07/11/2016   ALT 20 07/11/2016    Lab Results  Component Value Date   CHOL 195 07/11/2016   HDL 36.60 (L) 07/11/2016   LDLCALC 101 (H) 01/31/2016   LDLDIRECT 89.0 07/11/2016   TRIG 221.0 (H) 07/11/2016   CHOLHDL 5 07/11/2016    --------------------------------------------------------------------------------------------------  ASSESSMENT AND PLAN: Atypical chest pain Pain has resolved since her last visit. Exercise tolerance test was low risk. Only significant risk factors include age and hyperlipidemia, with most recent LDL been quite good at 89 off pharmacologic therapy. Encouraged the patient to continue taking omeprazole under the direction of Dr. Nicki Reaper for his history of GERD. No further workup is needed at this time.  Follow-up: Return to clinic as needed if chest pain recurs or other concerns arise.  Nelva Bush, MD 08/22/2016 7:43 AM

## 2016-08-22 ENCOUNTER — Encounter: Payer: Self-pay | Admitting: Internal Medicine

## 2016-09-10 ENCOUNTER — Ambulatory Visit (INDEPENDENT_AMBULATORY_CARE_PROVIDER_SITE_OTHER): Payer: BLUE CROSS/BLUE SHIELD | Admitting: Urology

## 2016-09-10 ENCOUNTER — Encounter: Payer: Self-pay | Admitting: Urology

## 2016-09-10 VITALS — BP 113/68 | HR 89 | Ht 72.0 in | Wt 180.9 lb

## 2016-09-10 DIAGNOSIS — R319 Hematuria, unspecified: Secondary | ICD-10-CM | POA: Diagnosis not present

## 2016-09-10 LAB — URINALYSIS, COMPLETE
BILIRUBIN UA: NEGATIVE
GLUCOSE, UA: NEGATIVE
KETONES UA: NEGATIVE
Leukocytes, UA: NEGATIVE
NITRITE UA: NEGATIVE
Protein, UA: NEGATIVE
SPEC GRAV UA: 1.015 (ref 1.005–1.030)
UUROB: 0.2 mg/dL (ref 0.2–1.0)
pH, UA: 5 (ref 5.0–7.5)

## 2016-09-10 LAB — MICROSCOPIC EXAMINATION
Bacteria, UA: NONE SEEN
Epithelial Cells (non renal): NONE SEEN /hpf (ref 0–10)
WBC, UA: NONE SEEN /hpf (ref 0–?)

## 2016-09-10 MED ORDER — LIDOCAINE HCL 2 % EX GEL
1.0000 "application " | Freq: Once | CUTANEOUS | Status: AC
  Administered 2016-09-10: 1 via URETHRAL

## 2016-09-10 MED ORDER — CIPROFLOXACIN HCL 500 MG PO TABS
500.0000 mg | ORAL_TABLET | Freq: Once | ORAL | Status: AC
  Administered 2016-09-10: 500 mg via ORAL

## 2016-09-10 NOTE — Progress Notes (Signed)
09/10/2016 2:49 PM   Howard Berry 1960/05/16 [id number]  Referring provider: Einar Pheasant, [address] [place], [place] [address]  Chief Complaint  Patient presents with  . Cysto    HPI: The patient had cystoscopy November 7th and he had a very small papillary lesion in the middle aspect of the trigone. Dr. Louis Meckel felt that the biopsy report may not be helpful due to the nature of the lesion and the biopsy. He wanted him re-cystoscoped in 6 months. No biopsy diagnosis was given on November 7  Today Frequency is stable. No gross blood in the urine. He has a distant smoking history  Urinalysis negative for blood today  He underwent cystoscopy after written consent. The penile bulbar membranous urethra normal. Prostatic urethra was normal. The bladder mucosa was normal. In the midline of the trigone near the interureteric ridge he had a small polyp likely about 2 mm in size. There is no erythema associated with it. There was no carcinoma in situ noted.   PMH: Past Medical History:  Diagnosis Date  . GERD (gastroesophageal reflux disease)    EGD (2007) - erosive duodenitis, reflux esophagitis, multiple gastric polyps  required dilatation.  . Hypercholesterolemia   . Left axis deviation    noted on prior EKG  . Recurrent sinusitis     Surgical History: Past Surgical History:  Procedure Laterality Date  . CHOLECYSTECTOMY  10/02/12   emergency surgery  . [place]  . TONSILLECTOMY  1966    Home Medications:  Allergies as of 09/10/2016      Reactions   Augmentin [amoxicillin-pot Clavulanate]    Chlor-trimeton [chlorpheniramine Maleate]    Penicillins       Medication List       Accurate as of 09/10/16  2:49 PM. Always use your most recent med list.          omeprazole 40 MG capsule Commonly known as:  PRILOSEC TAKE 1 CAPSULE DAILY (PHYSICAL DUE IN MAY)       Allergies:  Allergies  Allergen Reactions  . Augmentin  [Amoxicillin-Pot Clavulanate]   . Chlor-Trimeton [Chlorpheniramine Maleate]   . Penicillins     Family History: Family History  Problem Relation Age of Onset  . Heart disease Father   . Throat cancer Father   . Lung cancer Father   . Thyroid disease Mother   . Hematuria Mother   . Heart disease Maternal Grandmother   . Heart disease Paternal Grandfather     MI (died age 42)  . Colon cancer Neg Hx   . Prostate cancer Neg Hx     Social History:  reports that he has never smoked. His smokeless tobacco use includes Chew. He reports that he does not drink alcohol or use drugs.  ROS: UROLOGY Frequent Urination?: No Hard to postpone urination?: No Burning/pain with urination?: No Get up at night to urinate?: No Leakage of urine?: No Urine stream starts and stops?: No Trouble starting stream?: No Do you have to strain to urinate?: No Blood in urine?: No Urinary tract infection?: No Sexually transmitted disease?: No Injury to kidneys or bladder?: No Painful intercourse?: No Weak stream?: No Erection problems?: No Penile pain?: No  Gastrointestinal Nausea?: No Vomiting?: No Indigestion/heartburn?: No Diarrhea?: No Constipation?: No  Constitutional Fever: No Night sweats?: No Weight loss?: No Fatigue?: No  Skin Skin rash/lesions?: No Itching?: No  Eyes Blurred vision?: No Double vision?: No  Ears/Nose/Throat Sore throat?: No Sinus problems?:  No  Hematologic/Lymphatic Swollen glands?: No Easy bruising?: No  Cardiovascular Leg swelling?: No Chest pain?: No  Respiratory Cough?: No Shortness of breath?: No  Endocrine Excessive thirst?: No  Musculoskeletal Back pain?: No Joint pain?: No  Neurological Headaches?: No Dizziness?: No  Psychologic Depression?: No Anxiety?: No  Physical Exam: BP 113/68 (BP Location: Left Arm, Patient Position: Sitting, Cuff Size: Normal)   Pulse 89   Ht 6' (1.829 m)   Wt 180 lb 14.4 oz (82.1 kg)   BMI 24.53  kg/m   Constitutional:  Alert and oriented, No acute distress.  Laboratory Data: Lab Results  Component Value Date   WBC 6.5 07/11/2016   HGB 15.1 07/11/2016   HCT 44.2 07/11/2016   MCV 90.3 07/11/2016   PLT 226.0 07/11/2016    Lab Results  Component Value Date   CREATININE 1.03 07/11/2016    Lab Results  Component Value Date   PSA 0.38 07/11/2016   PSA 0.27 01/31/2016   PSA 0.70 03/05/2014    No results found for: TESTOSTERONE  No results found for: HGBA1C  Urinalysis    Component Value Date/Time   COLORURINE YELLOW 01/31/2016 1007   APPEARANCEUR CLEAR 01/31/2016 1007   LABSPEC 1.025 01/31/2016 1007   PHURINE 5.5 01/31/2016 1007   GLUCOSEU NEGATIVE 01/31/2016 1007   HGBUR TRACE-INTACT (A) 01/31/2016 1007   BILIRUBINUR NEGATIVE 01/31/2016 1007   KETONESUR NEGATIVE 01/31/2016 1007   UROBILINOGEN 0.2 01/31/2016 1007   NITRITE NEGATIVE 01/31/2016 1007   LEUKOCYTESUR NEGATIVE 01/31/2016 1007    Pertinent Imaging: none  Assessment & Plan:  The lesion in question likely is still present from the previous biopsy. I think for safety reasons that should be biopsied under anesthesia especially with a distant smoking history. The pros cons and risks and sequelae were discussed in detail.  In spite of discussing the differential diagnoses he chose not to have a biopsy. A little bit of a circular conversation but I think he understood things well. I agree that this likely represents a benign entity. He chose not to have the biopsy. Chose to f/up with primary doctor.   1. Hematuria, unspecified type 2. Bladder lesion    - Urinalysis, Complete - ciprofloxacin (CIPRO) tablet 500 mg; Take 1 tablet (500 mg total) by mouth once. - lidocaine (XYLOCAINE) 2 % jelly 1 application; Place 1 application into the urethra once.   No Follow-up on file.  Reece Packer, MD  [facility] [address], [place], [place] [address] [phone]

## 2016-09-18 ENCOUNTER — Encounter: Payer: Self-pay | Admitting: Internal Medicine

## 2016-09-18 ENCOUNTER — Ambulatory Visit (INDEPENDENT_AMBULATORY_CARE_PROVIDER_SITE_OTHER): Payer: BLUE CROSS/BLUE SHIELD | Admitting: Internal Medicine

## 2016-09-18 DIAGNOSIS — R0789 Other chest pain: Secondary | ICD-10-CM | POA: Diagnosis not present

## 2016-09-18 DIAGNOSIS — R319 Hematuria, unspecified: Secondary | ICD-10-CM

## 2016-09-18 DIAGNOSIS — K219 Gastro-esophageal reflux disease without esophagitis: Secondary | ICD-10-CM

## 2016-09-18 DIAGNOSIS — E7800 Pure hypercholesterolemia, unspecified: Secondary | ICD-10-CM

## 2016-09-18 MED ORDER — OMEPRAZOLE 40 MG PO CPDR: DELAYED_RELEASE_CAPSULE | ORAL | 1 refills | Status: DC

## 2016-09-18 NOTE — Progress Notes (Signed)
Patient ID: Howard Berry, male   DOB: [birth year], 56 y.o.   MRN: [id number]   Subjective:    Patient ID: Howard Berry, male    DOB: 1960/07/14, 56 y.o.   MRN: [id number]  HPI  Patient here for a scheduled follow up.  He was previously having chest pain.  See last note.  Saw cardiology.  Had ETT.  Had good exercise capacity with no limitations.  Has had no further chest pain.  No changes in blood pressure.  Feels good.  Stays active.  No sob.  No acid reflux.  No abdominal pain.  Bowels moving.  Overall feels good.     Past Medical History:  Diagnosis Date  . GERD (gastroesophageal reflux disease)    EGD (2007) - erosive duodenitis, reflux esophagitis, multiple gastric polyps  required dilatation.  . Hypercholesterolemia   . Left axis deviation    noted on prior EKG  . Recurrent sinusitis    Past Surgical History:  Procedure Laterality Date  . CHOLECYSTECTOMY  10/02/12   emergency surgery  . [place]  . TONSILLECTOMY  1966   Family History  Problem Relation Age of Onset  . Heart disease Father   . Throat cancer Father   . Lung cancer Father   . Thyroid disease Mother   . Hematuria Mother   . Heart disease Maternal Grandmother   . Heart disease Paternal Grandfather        MI (died age 14)  . Colon cancer Neg Hx   . Prostate cancer Neg Hx    Social History   Social History  . Marital status: Married    Spouse name: N/A  . Number of children: N/A  . Years of education: N/A   Social History Main Topics  . Smoking status: Never Smoker  . Smokeless tobacco: Current User    Types: Chew  . Alcohol use No  . Drug use: No  . Sexual activity: Not Asked   Other Topics Concern  . None   Social History Narrative  . None    Outpatient Encounter Prescriptions as of 09/18/2016  Medication Sig  . omeprazole (PRILOSEC) 40 MG capsule TAKE 1 CAPSULE DAILY (PHYSICAL DUE IN MAY)  . [DISCONTINUED] omeprazole (PRILOSEC) 40 MG capsule TAKE 1 CAPSULE DAILY (PHYSICAL DUE IN  MAY)   No facility-administered encounter medications on file as of 09/18/2016.     Review of Systems  Constitutional: Negative for appetite change and unexpected weight change.  HENT: Negative for congestion and sinus pressure.   Respiratory: Negative for cough, chest tightness and shortness of breath.   Cardiovascular: Negative for chest pain, palpitations and leg swelling.  Gastrointestinal: Negative for abdominal pain, diarrhea, nausea and vomiting.  Genitourinary: Negative for difficulty urinating and dysuria.  Musculoskeletal: Negative for back pain and joint swelling.  Skin: Negative for color change.  Neurological: Negative for dizziness, light-headedness and headaches.  Psychiatric/Behavioral: Negative for agitation and dysphoric mood.       Objective:    Physical Exam  Constitutional: He appears well-developed and well-nourished. No distress.  HENT:  Nose: Nose normal.  Mouth/Throat: Oropharynx is clear and moist.  Neck: Neck supple. No thyromegaly present.  Cardiovascular: Normal rate and regular rhythm.   Pulmonary/Chest: Effort normal and breath sounds normal. No respiratory distress.  Abdominal: Soft. Bowel sounds are normal. There is no tenderness.  Musculoskeletal: He exhibits no edema or tenderness.  Lymphadenopathy:    He has no cervical adenopathy.  Skin: No  rash noted. No erythema.  Psychiatric: He has a normal mood and affect. His behavior is normal.    BP 116/70 (BP Location: Left Arm, Patient Position: Sitting, Cuff Size: Normal)   Pulse 84   Temp 98.3 F (36.8 C) (Oral)   Resp 12   Ht 6' (1.829 m)   Wt 180 lb (81.6 kg)   SpO2 94%   BMI 24.41 kg/m  Wt Readings from Last 3 Encounters:  09/18/16 180 lb (81.6 kg)  09/10/16 180 lb 14.4 oz (82.1 kg)  08/21/16 178 lb 8 oz (81 kg)     Lab Results  Component Value Date   WBC 6.5 07/11/2016   HGB 15.1 07/11/2016   HCT 44.2 07/11/2016   PLT 226.0 07/11/2016   GLUCOSE 96 07/11/2016   CHOL 195  07/11/2016   TRIG 221.0 (H) 07/11/2016   HDL 36.60 (L) 07/11/2016   LDLDIRECT 89.0 07/11/2016   LDLCALC 101 (H) 01/31/2016   ALT 20 07/11/2016   AST 15 07/11/2016   NA 140 07/11/2016   K 4.7 07/11/2016   CL 102 07/11/2016   CREATININE 1.03 07/11/2016   BUN 15 07/11/2016   CO2 32 07/11/2016   TSH 0.76 07/11/2016   PSA 0.38 07/11/2016       Assessment & Plan:   Problem List Items Addressed This Visit    Atypical chest pain    Saw cardiology.  Stress test ok.  Feels good.  No further pain or episodes.  Follow.        GERD (gastroesophageal reflux disease)    Controlled on omeprazole.        Relevant Medications   omeprazole (PRILOSEC) 40 MG capsule   Hematuria    Being followed by urology.  S/p biopsy.       Hypercholesterolemia    Low cholesterol diet and exercise.  Follow lipid panel.            Einar Pheasant, MD

## 2016-09-18 NOTE — Progress Notes (Signed)
Pre-visit discussion using our clinic review tool. No additional management support is needed unless otherwise documented below in the visit note.  

## 2016-10-01 ENCOUNTER — Encounter: Payer: Self-pay | Admitting: Internal Medicine

## 2016-10-01 NOTE — Assessment & Plan Note (Signed)
Controlled on omeprazole.   

## 2016-10-01 NOTE — Assessment & Plan Note (Signed)
Being followed by urology.  S/p biopsy.

## 2016-10-01 NOTE — Assessment & Plan Note (Signed)
Low cholesterol diet and exercise.  Follow lipid panel.   

## 2016-10-01 NOTE — Assessment & Plan Note (Signed)
Saw cardiology.  Stress test ok.  Feels good.  No further pain or episodes.  Follow.

## 2016-12-20 DIAGNOSIS — J219 Acute bronchiolitis, unspecified: Secondary | ICD-10-CM | POA: Diagnosis not present

## 2016-12-20 DIAGNOSIS — R062 Wheezing: Secondary | ICD-10-CM | POA: Diagnosis not present

## 2017-01-15 DIAGNOSIS — R05 Cough: Secondary | ICD-10-CM | POA: Diagnosis not present

## 2017-02-05 ENCOUNTER — Telehealth: Payer: Self-pay | Admitting: Radiology

## 2017-02-05 ENCOUNTER — Ambulatory Visit (INDEPENDENT_AMBULATORY_CARE_PROVIDER_SITE_OTHER): Payer: BLUE CROSS/BLUE SHIELD | Admitting: Internal Medicine

## 2017-02-05 ENCOUNTER — Encounter (INDEPENDENT_AMBULATORY_CARE_PROVIDER_SITE_OTHER): Payer: Self-pay

## 2017-02-05 ENCOUNTER — Encounter: Payer: Self-pay | Admitting: Internal Medicine

## 2017-02-05 VITALS — BP 118/80 | HR 72 | Temp 98.3°F | Resp 17 | Ht 72.05 in | Wt 181.8 lb

## 2017-02-05 DIAGNOSIS — R319 Hematuria, unspecified: Secondary | ICD-10-CM | POA: Diagnosis not present

## 2017-02-05 DIAGNOSIS — Z0000 Encounter for general adult medical examination without abnormal findings: Secondary | ICD-10-CM

## 2017-02-05 DIAGNOSIS — Z1211 Encounter for screening for malignant neoplasm of colon: Secondary | ICD-10-CM | POA: Diagnosis not present

## 2017-02-05 DIAGNOSIS — E7800 Pure hypercholesterolemia, unspecified: Secondary | ICD-10-CM | POA: Diagnosis not present

## 2017-02-05 DIAGNOSIS — K219 Gastro-esophageal reflux disease without esophagitis: Secondary | ICD-10-CM

## 2017-02-05 DIAGNOSIS — Z23 Encounter for immunization: Secondary | ICD-10-CM | POA: Diagnosis not present

## 2017-02-05 LAB — COMPREHENSIVE METABOLIC PANEL
ALBUMIN: 4.4 g/dL (ref 3.5–5.2)
ALT: 19 U/L (ref 0–53)
AST: 13 U/L (ref 0–37)
Alkaline Phosphatase: 54 U/L (ref 39–117)
BILIRUBIN TOTAL: 0.5 mg/dL (ref 0.2–1.2)
BUN: 15 mg/dL (ref 6–23)
CALCIUM: 9.2 mg/dL (ref 8.4–10.5)
CO2: 30 meq/L (ref 19–32)
CREATININE: 0.99 mg/dL (ref 0.40–1.50)
Chloride: 102 mEq/L (ref 96–112)
GFR: 82.84 mL/min (ref >60.00)
Glucose, Bld: 94 mg/dL (ref 70–99)
Potassium: 4.2 mEq/L (ref 3.5–5.1)
Sodium: 139 mEq/L (ref 135–145)
TOTAL PROTEIN: 7.2 g/dL (ref 6.0–8.3)

## 2017-02-05 LAB — URINALYSIS, ROUTINE W REFLEX MICROSCOPIC
Bilirubin Urine: NEGATIVE
Ketones, ur: NEGATIVE
Nitrite: NEGATIVE
PH: 7 (ref 5.0–8.0)
Specific Gravity, Urine: 1.02 (ref 1.000–1.030)
TOTAL PROTEIN, URINE-UPE24: NEGATIVE
URINE GLUCOSE: NEGATIVE
Urobilinogen, UA: 0.2 (ref 0.0–1.0)
WBC, UA: NONE SEEN (ref 0–?)

## 2017-02-05 LAB — LIPID PANEL
CHOL/HDL RATIO: 5
Cholesterol: 205 mg/dL — ABNORMAL HIGH (ref 0–200)
HDL: 39.8 mg/dL (ref >39.00)
LDL Cholesterol: 125 mg/dL — ABNORMAL HIGH (ref 0–99)
NONHDL: 165.11
Triglycerides: 199 mg/dL — ABNORMAL HIGH (ref 0.0–149.0)
VLDL: 39.8 mg/dL (ref 0.0–40.0)

## 2017-02-05 NOTE — Assessment & Plan Note (Signed)
Physical today 02/05/17.  Colonoscopy 2012.  Recommended f/u in 10 years.  PSA .38 07/11/16.

## 2017-02-05 NOTE — Telephone Encounter (Signed)
Pt came down for blood work today and stated he was suppose to leave a urine specimen. Advised pt there was no orders placed but would get him to get a sample and lab staff would send a note to the provider. Please place urine orders if needed.

## 2017-02-05 NOTE — Telephone Encounter (Signed)
I add urinalysis to visit orders.  Thanks

## 2017-02-05 NOTE — Progress Notes (Signed)
Patient ID: Howard Berry, male   DOB: Nov 28, 1960, 56 y.o.   MRN: [id number]   Subjective:    Patient ID: Howard Berry, male    DOB: 10/17/1960, 56 y.o.   MRN: [id number]  HPI  Patient here for his physical exam.  Doing well.  Stays active.  No chest pain or tightness with increased activity or exertion.  No sob. No acid reflux.  No abdominal pain.  Was seen at acute care in 12/2016.  Diagnosed with bronchitis.  Notes reviewed.  Cough persisted.  Reevaluation 01/15/17.  cxr clear.  Treated with another abx? and prednisone.  Cough resolved.  No cough now.  No wheezing.  Overall feels good.     Past Medical History:  Diagnosis Date  . GERD (gastroesophageal reflux disease)    EGD (2007) - erosive duodenitis, reflux esophagitis, multiple gastric polyps  required dilatation.  . Hypercholesterolemia   . Left axis deviation    noted on prior EKG  . Recurrent sinusitis    Past Surgical History:  Procedure Laterality Date  . CHOLECYSTECTOMY  10/02/12   emergency surgery  . [place]  . TONSILLECTOMY  1966   Family History  Problem Relation Age of Onset  . Heart disease Father   . Throat cancer Father   . Lung cancer Father   . Thyroid disease Mother   . Hematuria Mother   . Heart disease Maternal Grandmother   . Heart disease Paternal Grandfather        MI (died age 89)  . Colon cancer Neg Hx   . Prostate cancer Neg Hx    Social History   Social History  . Marital status: Married    Spouse name: N/A  . Number of children: N/A  . Years of education: N/A   Social History Main Topics  . Smoking status: Never Smoker  . Smokeless tobacco: Current User    Types: Chew  . Alcohol use No  . Drug use: No  . Sexual activity: Not Asked   Other Topics Concern  . None   Social History Narrative  . None    Outpatient Encounter Prescriptions as of 02/05/2017  Medication Sig  . omeprazole (PRILOSEC) 40 MG capsule TAKE 1 CAPSULE DAILY (PHYSICAL DUE IN MAY)   No  facility-administered encounter medications on file as of 02/05/2017.     Review of Systems  Constitutional: Negative for appetite change and unexpected weight change.  HENT: Negative for congestion and sinus pressure.   Eyes: Negative for pain and visual disturbance.  Respiratory: Negative for cough, chest tightness and shortness of breath.   Cardiovascular: Negative for chest pain, palpitations and leg swelling.  Gastrointestinal: Negative for abdominal pain, diarrhea, nausea and vomiting.  Genitourinary: Negative for difficulty urinating and dysuria.  Musculoskeletal: Negative for back pain and joint swelling.  Skin: Negative for color change and rash.  Neurological: Negative for dizziness, light-headedness and headaches.  Hematological: Negative for adenopathy. Does not bruise/bleed easily.  Psychiatric/Behavioral: Negative for agitation and dysphoric mood.       Objective:    Physical Exam  Constitutional: He is oriented to person, place, and time. He appears well-developed and well-nourished. No distress.  HENT:  Head: Normocephalic and atraumatic.  Nose: Nose normal.  Mouth/Throat: Oropharynx is clear and moist. No oropharyngeal exudate.  Eyes: Conjunctivae are normal. Right eye exhibits no discharge. Left eye exhibits no discharge.  Neck: Neck supple. No thyromegaly present.  Cardiovascular: Normal rate and regular rhythm.  Pulmonary/Chest: Breath sounds normal. No respiratory distress. He has no wheezes.  Abdominal: Soft. Bowel sounds are normal. There is no tenderness.  Genitourinary:  Genitourinary Comments: Rectal:  No palpable prostate nodules.  Heme negative.    Musculoskeletal: He exhibits no edema or tenderness.  Lymphadenopathy:    He has no cervical adenopathy.  Neurological: He is alert and oriented to person, place, and time.  Skin: Skin is warm and dry. No rash noted. No erythema.  Psychiatric: He has a normal mood and affect. His behavior is normal.     BP 118/80 (BP Location: Left Arm, Patient Position: Sitting, Cuff Size: Normal)   Pulse 72   Temp 98.3 F (36.8 C) (Oral)   Resp 17   Ht 6' 0.05" (1.83 m)   Wt 181 lb 12.8 oz (82.5 kg)   SpO2 96%   BMI 24.62 kg/m  Wt Readings from Last 3 Encounters:  02/05/17 181 lb 12.8 oz (82.5 kg)  09/18/16 180 lb (81.6 kg)  09/10/16 180 lb 14.4 oz (82.1 kg)     Lab Results  Component Value Date   WBC 6.5 07/11/2016   HGB 15.1 07/11/2016   HCT 44.2 07/11/2016   PLT 226.0 07/11/2016   GLUCOSE 94 02/05/2017   CHOL 205 (H) 02/05/2017   TRIG 199.0 (H) 02/05/2017   HDL 39.80 02/05/2017   LDLDIRECT 89.0 07/11/2016   LDLCALC 125 (H) 02/05/2017   ALT 19 02/05/2017   AST 13 02/05/2017   NA 139 02/05/2017   K 4.2 02/05/2017   CL 102 02/05/2017   CREATININE 0.99 02/05/2017   BUN 15 02/05/2017   CO2 30 02/05/2017   TSH 0.76 07/11/2016   PSA 0.38 07/11/2016       Assessment & Plan:   Problem List Items Addressed This Visit    GERD (gastroesophageal reflux disease)    Controlled on omeprazole.        Health care maintenance    Physical today 02/05/17.  Colonoscopy 2012.  Recommended f/u in 10 years.  PSA .38 07/11/16.        Hematuria - Primary    Was evaluated by urology.  S/p biopsy. Discussed f/u.  He declines.  Agreed to recheck urinalysis.        Relevant Orders   Urinalysis, Routine w reflex microscopic (Completed)   Hypercholesterolemia    Low cholesterol diet and exercise.  Follow lipid panel.        Relevant Orders   Comprehensive metabolic panel (Completed)   Lipid panel (Completed)    Other Visit Diagnoses    Colon cancer screening       Relevant Orders   Fecal occult blood, imunochemical   Need for immunization against influenza       Relevant Orders   Flu Vaccine QUAD 36+ mos IM (Completed)       Einar Pheasant, MD

## 2017-02-05 NOTE — Assessment & Plan Note (Signed)
Low cholesterol diet and exercise.  Follow lipid panel.   

## 2017-02-08 ENCOUNTER — Encounter: Payer: Self-pay | Admitting: Internal Medicine

## 2017-02-08 NOTE — Assessment & Plan Note (Signed)
Controlled on omeprazole.   

## 2017-02-08 NOTE — Assessment & Plan Note (Signed)
Was evaluated by urology.  S/p biopsy. Discussed f/u.  He declines.  Agreed to recheck urinalysis.

## 2017-03-19 ENCOUNTER — Other Ambulatory Visit: Payer: Self-pay | Admitting: Internal Medicine

## 2017-04-23 DIAGNOSIS — J209 Acute bronchitis, unspecified: Secondary | ICD-10-CM | POA: Diagnosis not present

## 2017-06-15 ENCOUNTER — Other Ambulatory Visit: Payer: Self-pay

## 2017-06-15 ENCOUNTER — Ambulatory Visit
Admission: EM | Admit: 2017-06-15 | Discharge: 2017-06-15 | Disposition: A | Discharge status: Home / Self Care | Payer: BLUE CROSS/BLUE SHIELD | Attending: Family Medicine | Admitting: Family Medicine

## 2017-06-15 DIAGNOSIS — Z1159 Encounter for screening for other viral diseases: Secondary | ICD-10-CM | POA: Diagnosis not present

## 2017-06-15 DIAGNOSIS — Z20828 Contact with and (suspected) exposure to other viral communicable diseases: Secondary | ICD-10-CM

## 2017-06-15 MED ORDER — OSELTAMIVIR PHOSPHATE 75 MG PO CAPS: 75.0000 mg | ORAL_CAPSULE | Freq: Every day | ORAL | 0 refills | Status: DC

## 2017-06-15 NOTE — ED Triage Notes (Signed)
Patient states his wife was diagnosed with the flu on Thursday and he would like a prescription for tamiflu. Denies any symptoms.

## 2017-06-15 NOTE — ED Provider Notes (Signed)
[facility]    CSN: [id number] Arrival date & time: 06/15/17  1023     History   Chief Complaint Chief Complaint  Patient presents with  . Influenza    HPI Howard Berry is a 57 y.o. male.   57 yo male presents for rx for flu prophylaxis. States his wife was diagnosed with the flu and currently being treated. Patient denies any symptoms.    The history is provided by the patient.  Influenza    Past Medical History:  Diagnosis Date  . GERD (gastroesophageal reflux disease)    EGD (2007) - erosive duodenitis, reflux esophagitis, multiple gastric polyps  required dilatation.  . Hypercholesterolemia   . Left axis deviation    noted on prior EKG  . Recurrent sinusitis     Patient Active Problem List   Diagnosis Date Noted  . Health care maintenance 09/22/2014  . Nasal crusting 09/22/2014  . Lightheaded 07/30/2013  . Headache(784.0) 07/30/2013  . Atypical chest pain 07/30/2013  . Sinusitis 02/28/2013  . Hypercholesterolemia 05/13/2012  . GERD (gastroesophageal reflux disease) 05/13/2012  . Low testosterone 05/13/2012  . Hematuria 05/13/2012    Past Surgical History:  Procedure Laterality Date  . CHOLECYSTECTOMY  10/02/12   emergency surgery  . [place]  . TONSILLECTOMY  1966       Home Medications    Prior to Admission medications   Medication Sig Start Date End Date Taking? Authorizing Provider  omeprazole (PRILOSEC) 40 MG capsule TAKE 1 CAPSULE DAILY (PHYSICAL DUE IN MAY) 03/19/17  Yes Einar Pheasant, MD  oseltamivir (TAMIFLU) 75 MG capsule Take 1 capsule (75 mg total) by mouth daily. 06/15/17   Norval Gable, MD    Family History Family History  Problem Relation Age of Onset  . Heart disease Father   . Throat cancer Father   . Lung cancer Father   . Thyroid disease Mother   . Hematuria Mother   . Heart disease Maternal Grandmother   . Heart disease Paternal Grandfather        MI (died age 36)  . Colon cancer Neg Hx   .  Prostate cancer Neg Hx     Social History Social History   Tobacco Use  . Smoking status: Never Smoker  . Smokeless tobacco: Current User    Types: Chew  Substance Use Topics  . Alcohol use: No    Alcohol/week: 0.0 oz  . Drug use: No     Allergies   Augmentin [amoxicillin-pot clavulanate]; Chlor-trimeton [chlorpheniramine maleate]; and Penicillins   Review of Systems Review of Systems   Physical Exam Triage Vital Signs ED Triage Vitals  Enc Vitals Group     BP 06/15/17 1035 132/84     Pulse Rate 06/15/17 1035 86     Resp --      Temp 06/15/17 1035 98.6 F (37 C)     Temp Source 06/15/17 1035 Oral     SpO2 06/15/17 1035 99 %     Weight 06/15/17 1036 180 lb (81.6 kg)     Height 06/15/17 1036 6' (1.829 m)     Head Circumference --      Peak Flow --      Pain Score --      Pain Loc --      Pain Edu? --      Excl. in [place]? --    No data found.  Updated Vital Signs BP 132/84 (BP Location: Left Arm)  Pulse 86   Temp 98.6 F (37 C) (Oral)   Ht 6' (1.829 m)   Wt 180 lb (81.6 kg)   SpO2 99%   BMI 24.41 kg/m   Visual Acuity Right Eye Distance:   Left Eye Distance:   Bilateral Distance:    Right Eye Near:   Left Eye Near:    Bilateral Near:     Physical Exam  Constitutional: He appears well-developed and well-nourished. No distress.  Cardiovascular: Normal rate.  Pulmonary/Chest: Effort normal. No respiratory distress.  Skin: He is not diaphoretic.  Nursing note and vitals reviewed.    UC Treatments / Results  Labs (all labs ordered are listed, but only abnormal results are displayed) Labs Reviewed - No data to display  EKG  EKG Interpretation None       Radiology No results found.  Procedures Procedures (including critical care time)  Medications Ordered in UC Medications - No data to display   Initial Impression / Assessment and Plan / UC Course  I have reviewed the triage vital signs and the nursing notes.  Pertinent labs &  imaging results that were available during my care of the patient were reviewed by me and considered in my medical decision making (see chart for details).       Final Clinical Impressions(s) / UC Diagnoses   Final diagnoses:  Exposure to influenza    ED Discharge Orders        Ordered    oseltamivir (TAMIFLU) 75 MG capsule  Daily     06/15/17 1113     1. diagnosis reviewed with patient 2. rx as per orders above; reviewed possible side effects, interactions, risks and benefits  3. F/U prn  Controlled Substance Prescriptions [place] Controlled Substance Registry consulted? Not Applicable   Norval Gable, MD 06/15/17 [phone]

## 2017-08-07 ENCOUNTER — Encounter: Payer: Self-pay | Admitting: Internal Medicine

## 2017-08-07 ENCOUNTER — Ambulatory Visit (INDEPENDENT_AMBULATORY_CARE_PROVIDER_SITE_OTHER): Payer: BLUE CROSS/BLUE SHIELD | Admitting: Internal Medicine

## 2017-08-07 ENCOUNTER — Ambulatory Visit (INDEPENDENT_AMBULATORY_CARE_PROVIDER_SITE_OTHER): Payer: BLUE CROSS/BLUE SHIELD

## 2017-08-07 VITALS — BP 114/72 | HR 74 | Temp 98.4°F | Resp 15 | Ht 72.0 in | Wt 183.2 lb

## 2017-08-07 DIAGNOSIS — Z125 Encounter for screening for malignant neoplasm of prostate: Secondary | ICD-10-CM

## 2017-08-07 DIAGNOSIS — R053 Chronic cough: Secondary | ICD-10-CM

## 2017-08-07 DIAGNOSIS — R0989 Other specified symptoms and signs involving the circulatory and respiratory systems: Secondary | ICD-10-CM | POA: Diagnosis not present

## 2017-08-07 DIAGNOSIS — K219 Gastro-esophageal reflux disease without esophagitis: Secondary | ICD-10-CM

## 2017-08-07 DIAGNOSIS — R059 Cough, unspecified: Secondary | ICD-10-CM

## 2017-08-07 DIAGNOSIS — E7800 Pure hypercholesterolemia, unspecified: Secondary | ICD-10-CM

## 2017-08-07 DIAGNOSIS — R05 Cough: Secondary | ICD-10-CM

## 2017-08-07 LAB — CBC WITH DIFFERENTIAL/PLATELET
BASOS ABS: 0.1 10*3/uL (ref 0.0–0.1)
Basophils Relative: 0.8 % (ref 0.0–3.0)
EOS ABS: 0.4 10*3/uL (ref 0.0–0.7)
Eosinophils Relative: 6 % — ABNORMAL HIGH (ref 0.0–5.0)
HCT: 44.2 % (ref 39.0–52.0)
Hemoglobin: 15.2 g/dL (ref 13.0–17.0)
LYMPHS ABS: 1.9 10*3/uL (ref 0.7–4.0)
Lymphocytes Relative: 26.9 % (ref 12.0–46.0)
MCHC: 34.5 g/dL (ref 30.0–36.0)
MCV: 90.2 fl (ref 78.0–100.0)
MONO ABS: 0.6 10*3/uL (ref 0.1–1.0)
Monocytes Relative: 8 % (ref 3.0–12.0)
NEUTROS ABS: 4.1 10*3/uL (ref 1.4–7.7)
NEUTROS PCT: 58.3 % (ref 43.0–77.0)
PLATELETS: 230 10*3/uL (ref 150.0–400.0)
RBC: 4.9 Mil/uL (ref 4.22–5.81)
RDW: 12.9 % (ref 11.5–15.5)
WBC: 7 10*3/uL (ref 4.0–10.5)

## 2017-08-07 LAB — COMPREHENSIVE METABOLIC PANEL
ALT: 20 U/L (ref 0–53)
AST: 20 U/L (ref 0–37)
Albumin: 4.3 g/dL (ref 3.5–5.2)
Alkaline Phosphatase: 70 U/L (ref 39–117)
BILIRUBIN TOTAL: 0.6 mg/dL (ref 0.2–1.2)
BUN: 16 mg/dL (ref 6–23)
CO2: 29 meq/L (ref 19–32)
CREATININE: 0.95 mg/dL (ref 0.40–1.50)
Calcium: 9.3 mg/dL (ref 8.4–10.5)
Chloride: 102 mEq/L (ref 96–112)
GFR: 86.72 mL/min (ref >60.00)
GLUCOSE: 94 mg/dL (ref 70–99)
Potassium: 4.6 mEq/L (ref 3.5–5.1)
SODIUM: 140 meq/L (ref 135–145)
Total Protein: 7.4 g/dL (ref 6.0–8.3)

## 2017-08-07 LAB — LIPID PANEL
CHOLESTEROL: 174 mg/dL (ref 0–200)
HDL: 36.2 mg/dL — ABNORMAL LOW (ref >39.00)
LDL CALC: 102 mg/dL — AB (ref 0–99)
NonHDL: 137.51
Total CHOL/HDL Ratio: 5
Triglycerides: 177 mg/dL — ABNORMAL HIGH (ref 0.0–149.0)
VLDL: 35.4 mg/dL (ref 0.0–40.0)

## 2017-08-07 LAB — PSA: PSA: 0.29 ng/mL (ref 0.10–4.00)

## 2017-08-07 LAB — TSH: TSH: 0.98 u[IU]/mL (ref 0.35–4.50)

## 2017-08-07 MED ORDER — LEVOFLOXACIN 500 MG PO TABS: 500.0000 mg | ORAL_TABLET | Freq: Every day | ORAL | 0 refills | Status: DC

## 2017-08-07 MED ORDER — PREDNISONE 10 MG PO TABS: ORAL_TABLET | ORAL | 0 refills | Status: DC

## 2017-08-07 NOTE — Patient Instructions (Signed)
nasacort nasal spray - 2 sprays each nostril one time per day.  Do this in the evening.    Take the prednisone and levaquin as directed.    Take a probiotic daily while you are on the antibiotic and for two weeks after completing the antibiotic.    Examples of probiotics:  Florastor, culturelle and align

## 2017-08-07 NOTE — Progress Notes (Signed)
Patient ID: Howard Berry, male   DOB: [birth year], 57 y.o.   MRN: [id number]   Subjective:    Patient ID: Howard Berry, male    DOB: Dec 22, 1960, 57 y.o.   MRN: [id number]  HPI  Patient here for  A scheduled follow up.  He reports he was seen 06/2017 and given tamiflu for prophylaxis.  He reports that starting in November, developed increased cough and congestion.  Has been on a couple of different rounds of abx.  Reports now having persistent cough and increased drainage. Some wheezing.  Having coughing fits.  No chest pain.  No acid reflux.  No abdominal pain.  Bowels moving.  Has inhalers.  Not using.  Has taken prednisone previously.  No fever.  Eating and drinking well.  No vomiting.     Past Medical History:  Diagnosis Date  . GERD (gastroesophageal reflux disease)    EGD (2007) - erosive duodenitis, reflux esophagitis, multiple gastric polyps  required dilatation.  . Hypercholesterolemia   . Left axis deviation    noted on prior EKG  . Recurrent sinusitis    Past Surgical History:  Procedure Laterality Date  . CHOLECYSTECTOMY  10/02/12   emergency surgery  . [place]  . TONSILLECTOMY  1966   Family History  Problem Relation Age of Onset  . Heart disease Father   . Throat cancer Father   . Lung cancer Father   . Thyroid disease Mother   . Hematuria Mother   . Heart disease Maternal Grandmother   . Heart disease Paternal Grandfather        MI (died age 57)  . Colon cancer Neg Hx   . Prostate cancer Neg Hx    Social History   Socioeconomic History  . Marital status: Married    Spouse name: Not on file  . Number of children: Not on file  . Years of education: Not on file  . Highest education level: Not on file  Occupational History  . Not on file  Social Needs  . Financial resource strain: Not on file  . Food insecurity:    Worry: Not on file    Inability: Not on file  . Transportation needs:    Medical: Not on file    Non-medical: Not on file  Tobacco  Use  . Smoking status: Never Smoker  . Smokeless tobacco: Current User    Types: Chew  Substance and Sexual Activity  . Alcohol use: No    Alcohol/week: 0.0 oz  . Drug use: No  . Sexual activity: Not on file  Lifestyle  . Physical activity:    Days per week: Not on file    Minutes per session: Not on file  . Stress: Not on file  Relationships  . Social connections:    Talks on phone: Not on file    Gets together: Not on file    Attends religious service: Not on file    Active member of club or organization: Not on file    Attends meetings of clubs or organizations: Not on file    Relationship status: Not on file  Other Topics Concern  . Not on file  Social History Narrative  . Not on file    Outpatient Encounter Medications as of 08/07/2017  Medication Sig  . omeprazole (PRILOSEC) 40 MG capsule TAKE 1 CAPSULE DAILY (PHYSICAL DUE IN MAY)  . levofloxacin (LEVAQUIN) 500 MG tablet Take 1 tablet (500 mg total) by mouth daily.  Marland Kitchen  predniSONE (DELTASONE) 10 MG tablet Take 6 tablets x 1 day and then decrease by 1/2 tablet per day until down to zero mg.  . [DISCONTINUED] oseltamivir (TAMIFLU) 75 MG capsule Take 1 capsule (75 mg total) by mouth daily. (Patient not taking: Reported on 08/07/2017)   No facility-administered encounter medications on file as of 08/07/2017.     Review of Systems  Constitutional: Negative for appetite change and fever.  HENT: Positive for congestion and postnasal drip. Negative for sinus pressure.   Respiratory: Positive for cough and wheezing. Negative for chest tightness and shortness of breath.        Increased congestion.    Cardiovascular: Negative for chest pain, palpitations and leg swelling.  Gastrointestinal: Negative for abdominal pain, diarrhea, nausea and vomiting.  Genitourinary: Negative for difficulty urinating and dysuria.  Musculoskeletal: Negative for joint swelling and myalgias.  Skin: Negative for color change and rash.  Neurological:  Negative for dizziness, light-headedness and headaches.  Psychiatric/Behavioral: Negative for agitation and dysphoric mood.       Objective:    Physical Exam  Constitutional: He appears well-developed and well-nourished. No distress.  HENT:  Nose: Nose normal.  Mouth/Throat: Oropharynx is clear and moist.  Neck: Neck supple.  Cardiovascular: Normal rate and regular rhythm.  Pulmonary/Chest: Effort normal. No respiratory distress.  Increased cough with forced expiration.  Some increased cough with forced expiration.    Abdominal: Soft. Bowel sounds are normal. There is no tenderness.  Musculoskeletal: He exhibits no edema or tenderness.  Lymphadenopathy:    He has no cervical adenopathy.  Skin: No rash noted. No erythema.  Psychiatric: He has a normal mood and affect. His behavior is normal.    BP 114/72 (BP Location: Left Arm, Patient Position: Sitting, Cuff Size: Normal)   Pulse 74   Temp 98.4 F (36.9 C) (Oral)   Resp 15   Ht 6' (1.829 m)   Wt 183 lb 3.2 oz (83.1 kg)   SpO2 95%   BMI 24.85 kg/m  Wt Readings from Last 3 Encounters:  08/07/17 183 lb 3.2 oz (83.1 kg)  06/15/17 180 lb (81.6 kg)  02/05/17 181 lb 12.8 oz (82.5 kg)     Lab Results  Component Value Date   WBC 7.0 08/07/2017   HGB 15.2 08/07/2017   HCT 44.2 08/07/2017   PLT 230.0 08/07/2017   GLUCOSE 94 08/07/2017   CHOL 174 08/07/2017   TRIG 177.0 (H) 08/07/2017   HDL 36.20 (L) 08/07/2017   LDLDIRECT 89.0 07/11/2016   LDLCALC 102 (H) 08/07/2017   ALT 20 08/07/2017   AST 20 08/07/2017   NA 140 08/07/2017   K 4.6 08/07/2017   CL 102 08/07/2017   CREATININE 0.95 08/07/2017   BUN 16 08/07/2017   CO2 29 08/07/2017   TSH 0.98 08/07/2017   PSA 0.29 08/07/2017       Assessment & Plan:   Problem List Items Addressed This Visit    Cough    Persistent cough and congestion with wheezing and coughing fits.  Treat with prednisone taper.  levaquin as directed.  Probiotics as directed.  nasacort nasal  spray, saline nasal spray and albuterol inhaler as directed.  Check cxr.        GERD (gastroesophageal reflux disease)    Controlled on current regimen.  Follow.        Hypercholesterolemia - Primary    Low cholesterol diet and exercise.  Follow lipid panel.        Relevant Orders  CBC with Differential/Platelet (Completed)   Comprehensive metabolic panel (Completed)   TSH (Completed)   Lipid panel (Completed)    Other Visit Diagnoses    Prostate cancer screening       Relevant Orders   PSA (Completed)   Persistent cough       Relevant Orders   DG Chest 2 View (Completed)       Einar Pheasant, MD

## 2017-08-11 ENCOUNTER — Encounter: Payer: Self-pay | Admitting: Internal Medicine

## 2017-08-11 DIAGNOSIS — R05 Cough: Secondary | ICD-10-CM | POA: Insufficient documentation

## 2017-08-11 DIAGNOSIS — R059 Cough, unspecified: Secondary | ICD-10-CM | POA: Insufficient documentation

## 2017-08-11 NOTE — Assessment & Plan Note (Signed)
Persistent cough and congestion with wheezing and coughing fits.  Treat with prednisone taper.  levaquin as directed.  Probiotics as directed.  nasacort nasal spray, saline nasal spray and albuterol inhaler as directed.  Check cxr.

## 2017-08-11 NOTE — Assessment & Plan Note (Signed)
Controlled on current regimen.  Follow.  

## 2017-08-11 NOTE — Assessment & Plan Note (Signed)
Low cholesterol diet and exercise.  Follow lipid panel.   

## 2017-08-15 ENCOUNTER — Other Ambulatory Visit: Payer: Self-pay | Admitting: Internal Medicine

## 2017-08-15 DIAGNOSIS — R05 Cough: Secondary | ICD-10-CM

## 2017-08-15 DIAGNOSIS — R059 Cough, unspecified: Secondary | ICD-10-CM

## 2017-08-15 NOTE — Progress Notes (Signed)
Order placed for pulmonary referral.  

## 2017-08-26 NOTE — Progress Notes (Signed)
[facility] Consultation      Assessment and Plan:  Chronic bronchitis. -Discussed that he may have some degree of chronic bronchitis, he was previously at a very high level of fitness, he has noticed recently that he is not able to do as much as he used to, such as carrying 50 pound sacks, or carrying a deer carcass for several 100 yards.  When he does these things he feels winded for 10-15 minutes, this may be secondary to mild deconditioning with age.  -Discussed that we could try him on empiric trial of Spiriva, however the patient does not want to take regular medications, therefore he is given a prescription for albuterol metered-dose inhaler to be used as needed when he developed his symptoms.   Date: 08/27/2017  MRN# [id number] Howard Berry 26-Nov-1960  Referring Physician: Dr. Nicki Reaper.  Howard Berry is a 57 y.o. old male seen in consultation for chief complaint of:    Chief Complaint  Patient presents with  . Consult    bronchitis: SOB w/activity: cough    HPI:   The patient is a 57 year old male presenting with a persistent cough.  He recently saw his primary care physician and was treated with a course of prednisone and Levaquin. His symptoms started in August of 2018 where he was diagnosed with pneumonia requiring several course of pneumonia. He improved, but then around at the end of last year he had an episode where after walking through heavy bushes he ran out of breath. Recently he was having wheezing again and cough with some dyspnea, got another course of abx.  Currently he is feeling fine, he has no dyspnea. He does not exercise. He works 2 jobs, he walks much of the time, he works in [employer]rap[employer], music.  He goes deer hunting every year and can drag a carcass several hundred yards without issues. This past year he did not go very often.   He has a cat at home, sleeps in bedroom. He has reflux, controlled with omeprazole. Has occasional  sinus drainage. Is a self professed "hard-head" and will not take regular medications unless absolutely necessary.   He last smoked 10 yrs ago. He has never been diagnosed with respiratory issues in the past and normally does not have a problems with getting around.   **Imaging personally reviewed, chest x-ray 08/07/17, increased bronchovascular markings, changes of chronic bronchitis, otherwise lungs are unremarkable.  Slight right vascular prominence, which appears to have been present since previous films on 2015.   PMHX:   Past Medical History:  Diagnosis Date  . GERD (gastroesophageal reflux disease)    EGD (2007) - erosive duodenitis, reflux esophagitis, multiple gastric polyps  required dilatation.  . Hypercholesterolemia   . Left axis deviation    noted on prior EKG  . Recurrent sinusitis    Surgical Hx:  Past Surgical History:  Procedure Laterality Date  . CHOLECYSTECTOMY  10/02/12   emergency surgery  . [place]  . TONSILLECTOMY  1966   Family Hx:  Family History  Problem Relation Age of Onset  . Heart disease Father   . Throat cancer Father   . Lung cancer Father   . Thyroid disease Mother   . Hematuria Mother   . Heart disease Maternal Grandmother   . Heart disease Paternal Grandfather        MI (died age 78)  . Colon cancer Neg Hx   . Prostate cancer Neg  Hx    Social Hx:   Social History   Tobacco Use  . Smoking status: Never Smoker  . Smokeless tobacco: Current User    Types: Chew  Substance Use Topics  . Alcohol use: No    Alcohol/week: 0.0 oz  . Drug use: No   Medication:    Current Outpatient Medications:  .  omeprazole (PRILOSEC) 40 MG capsule, TAKE 1 CAPSULE DAILY (PHYSICAL DUE IN MAY), Disp: 90 capsule, Rfl: 1   Allergies:  Augmentin [amoxicillin-pot clavulanate]; Chlor-trimeton [chlorpheniramine maleate]; and Penicillins  Review of Systems: Gen:  Denies  fever, sweats, chills HEENT: Denies blurred vision, double vision.  bleeds, sore throat Cvc:  No dizziness, chest pain. Resp:   Denies cough or sputum production, shortness of breath Gi: Denies swallowing difficulty, stomach pain. Gu:  Denies bladder incontinence, burning urine Ext:   No Joint pain, stiffness. Skin: No skin rash,  hives  Endoc:  No polyuria, polydipsia. Psych: No depression, insomnia. Other:  All other systems were reviewed with the patient and were negative other that what is mentioned in the HPI.   Physical Examination:   VS: BP 120/80 (BP Location: Left Arm, Cuff Size: Normal)   Pulse 88   Ht 6' (1.829 m)   Wt 182 lb (82.6 kg)   SpO2 97%   BMI 24.68 kg/m   General Appearance: No distress  Neuro:without focal findings,  speech normal,  HEENT: PERRLA, EOM intact.   Pulmonary: normal breath sounds, No wheezing.  CardiovascularNormal S1,S2.  No m/r/g.   Abdomen: Benign, Soft, non-tender. Renal:  No costovertebral tenderness  GU:  No performed at this time. Endoc: No evident thyromegaly, no signs of acromegaly. Skin:   warm, no rashes, no ecchymosis  Extremities: normal, no cyanosis, clubbing.  Other findings:    LABORATORY PANEL:   CBC No results for input(s): WBC, HGB, HCT, PLT in the last 168 hours. ------------------------------------------------------------------------------------------------------------------  Chemistries  No results for input(s): NA, K, CL, CO2, GLUCOSE, BUN, CREATININE, CALCIUM, MG, AST, ALT, ALKPHOS, BILITOT in the last 168 hours.  Invalid input(s): GFRCGP ------------------------------------------------------------------------------------------------------------------  Cardiac Enzymes No results for input(s): TROPONINI in the last 168 hours. ------------------------------------------------------------  RADIOLOGY:  No results found.     Thank  you for the consultation and for allowing [facility] to assist in the care of your patient. Our recommendations are  noted above.  Please contact us if we can be of further service.   Marda Stalker, MD.  Board Certified in Internal Medicine, Pulmonary Medicine, [place], and Sleep Medicine.  [facility] Office Number: [phone]  Patricia Pesa, M.D.  Merton Border, M.D  08/27/2017

## 2017-08-27 ENCOUNTER — Ambulatory Visit (INDEPENDENT_AMBULATORY_CARE_PROVIDER_SITE_OTHER): Payer: BLUE CROSS/BLUE SHIELD | Admitting: Internal Medicine

## 2017-08-27 ENCOUNTER — Encounter: Payer: Self-pay | Admitting: Internal Medicine

## 2017-08-27 VITALS — BP 120/80 | HR 88 | Ht 72.0 in | Wt 182.0 lb

## 2017-08-27 DIAGNOSIS — J449 Chronic obstructive pulmonary disease, unspecified: Secondary | ICD-10-CM | POA: Diagnosis not present

## 2017-08-27 MED ORDER — ALBUTEROL SULFATE HFA 108 (90 BASE) MCG/ACT IN AERS: 2.0000 | INHALATION_SPRAY | Freq: Four times a day (QID) | RESPIRATORY_TRACT | 2 refills | Status: DC | PRN

## 2017-08-27 NOTE — Patient Instructions (Addendum)
Start albuterol MDI for occasional symptoms.

## 2017-09-15 ENCOUNTER — Other Ambulatory Visit: Payer: Self-pay | Admitting: Internal Medicine

## 2017-10-15 ENCOUNTER — Ambulatory Visit: Payer: BLUE CROSS/BLUE SHIELD | Admitting: Internal Medicine

## 2018-01-02 ENCOUNTER — Encounter: Payer: Self-pay | Admitting: Internal Medicine

## 2018-01-03 ENCOUNTER — Other Ambulatory Visit: Payer: Self-pay

## 2018-01-03 MED ORDER — OMEPRAZOLE 40 MG PO CPDR: 40.0000 mg | DELAYED_RELEASE_CAPSULE | Freq: Every day | ORAL | 1 refills | Status: DC

## 2018-01-13 NOTE — Progress Notes (Signed)
[facility] Consultation      Assessment and Plan:  Chronic bronchitis with dyspnea on exertion. --continues to have symptoms of cough and chronic bronchitis.  --Will start trial of trilogy inhaler for 1 month.  He is given samples, asked to call us back in 1 month with progress.  If he is improved we will call in a prescription, if not we will send him for a lung function test and referral to gastroenterology for GERD.  GERD. --He continues to wake at night with coughing which may be due to reflux.  --Continue omeprazole. He has had his esophagus stretched in the past, he may need to be referred back to GI for endoscopy if his symptoms do not improve.  Date: 01/13/2018  MRN# [id number] Howard Berry 12-08-1960   Howard Berry is a 57 y.o. old male seen in consultation for chief complaint of:    Chief Complaint  Patient presents with  . Follow-up    prod cough: wheezing    HPI:  The patient is a 57 year old male, he is very active with outdoor activities.  Over the last few years he is noticed some dyspnea with heavy exertion with activities activities such as carrying a deer carcass or hiking uphill through the woods.  At a previous visit he was given a trial of Spiriva but declined to use it, as he was against taking regular medications, therefore I prescribed him an albuterol metered-dose inhaler to be used as needed. Since that time he has not used it or needed it. He had an episode where he was choking on water and then he was coughing a lot and then had some feeling that his lungs were inflamed, then he was fine by the next morning.  Over the last few weeks he gets woken up and has to cough stuff up which is usually yellow, and then he feels better.  He remains on omeprazole. He has had his esophagus stretched about 10 years ago.   He last smoked 10 yrs ago. He has never been diagnosed with respiratory issues in the past and normally does not have a problems  with getting around.   **Imaging personally reviewed, chest x-ray 08/07/17, increased bronchovascular markings, changes of chronic bronchitis, otherwise lungs are unremarkable.  Slight right vascular prominence, which appears to have been present since previous films on 2015.  Medication:    Current Outpatient Medications:  .  albuterol (PROVENTIL HFA;VENTOLIN HFA) 108 (90 Base) MCG/ACT inhaler, Inhale 2 puffs into the lungs every 6 (six) hours as needed for wheezing or shortness of breath., Disp: 1 Inhaler, Rfl: 2 .  omeprazole (PRILOSEC) 40 MG capsule, Take 1 capsule (40 mg total) by mouth daily., Disp: 90 capsule, Rfl: 1   Allergies:  Augmentin [amoxicillin-pot clavulanate]; Chlor-trimeton [chlorpheniramine maleate]; and Penicillins  Review of Systems:  Constitutional: Feels well. Cardiovascular: No chest pain.  Pulmonary: Denies hemoptysis.   The remainder of systems were reviewed and were found to be negative other than what is documented in the HPI.    Physical Examination:   VS: BP 102/70 (BP Location: Left Arm, Cuff Size: Normal)   Pulse 90   Ht 6' (1.829 m)   Wt 178 lb (80.7 kg)   SpO2 96%   BMI 24.14 kg/m   General Appearance: No distress  Neuro:without focal findings, mental status, speech normal, alert and oriented HEENT: PERRLA, EOM intact Pulmonary: No wheezing, No rales  CardiovascularNormal S1,S2.  No m/r/g.  Abdomen:  Benign, Soft, non-tender, No masses Renal:  No costovertebral tenderness  GU:  No performed at this time. Endoc: No evident thyromegaly, no signs of acromegaly or Cushing features Skin:   warm, no rashes, no ecchymosis  Extremities: normal, no cyanosis, clubbing.      LABORATORY PANEL:   CBC No results for input(s): WBC, HGB, HCT, PLT in the last 168 hours. ------------------------------------------------------------------------------------------------------------------  Chemistries  No results for input(s): NA, K, CL, CO2, GLUCOSE, BUN,  CREATININE, CALCIUM, MG, AST, ALT, ALKPHOS, BILITOT in the last 168 hours.  Invalid input(s): GFRCGP ------------------------------------------------------------------------------------------------------------------  Cardiac Enzymes No results for input(s): TROPONINI in the last 168 hours. ------------------------------------------------------------  RADIOLOGY:  No results found.     Thank  you for the consultation and for allowing [facility] to assist in the care of your patient. Our recommendations are noted above.  Please contact us if we can be of further service.  Marda Stalker, M.D., F.C.C.P.  Board Certified in Internal Medicine, Pulmonary Medicine, [place], and Sleep Medicine.  [facility] Office Number: [phone]   01/13/2018

## 2018-01-14 ENCOUNTER — Ambulatory Visit (INDEPENDENT_AMBULATORY_CARE_PROVIDER_SITE_OTHER): Payer: BLUE CROSS/BLUE SHIELD | Admitting: Internal Medicine

## 2018-01-14 ENCOUNTER — Encounter: Payer: Self-pay | Admitting: Internal Medicine

## 2018-01-14 VITALS — BP 102/70 | HR 90 | Ht 72.0 in | Wt 178.0 lb

## 2018-01-14 DIAGNOSIS — J449 Chronic obstructive pulmonary disease, unspecified: Secondary | ICD-10-CM | POA: Diagnosis not present

## 2018-01-14 DIAGNOSIS — R0609 Other forms of dyspnea: Secondary | ICD-10-CM

## 2018-01-14 MED ORDER — FLUTICASONE-UMECLIDIN-VILANT 100-62.5-25 MCG/INH IN AEPB: 1.0000 | INHALATION_SPRAY | Freq: Every day | RESPIRATORY_TRACT | 0 refills | Status: DC

## 2018-01-14 NOTE — Addendum Note (Signed)
Addended by: Stephanie Coup on: 01/14/2018 04:40 PM   Modules accepted: Orders

## 2018-01-14 NOTE — Patient Instructions (Addendum)
Will start inhaler for 4 weeks.  Call us back with progress in 4 weeks (when the inhalers run out).

## 2018-02-04 ENCOUNTER — Telehealth: Payer: Self-pay | Admitting: Internal Medicine

## 2018-02-04 NOTE — Telephone Encounter (Signed)
Pt states the Trelegy that was prescribed is working "100%". He asks if he should stay on this. Please call and advise. If unable to call today, pt will not be available until after 3:30 tomorrow

## 2018-02-05 MED ORDER — FLUTICASONE-UMECLIDIN-VILANT 100-62.5-25 MCG/INH IN AEPB: 1.0000 | INHALATION_SPRAY | Freq: Every day | RESPIRATORY_TRACT | 3 refills | Status: DC

## 2018-02-05 NOTE — Telephone Encounter (Signed)
Left message that Trelegy has been sent to [facility] location. Nothing further needed.

## 2018-02-11 ENCOUNTER — Ambulatory Visit (INDEPENDENT_AMBULATORY_CARE_PROVIDER_SITE_OTHER): Payer: BLUE CROSS/BLUE SHIELD | Admitting: Internal Medicine

## 2018-02-11 ENCOUNTER — Encounter: Payer: Self-pay | Admitting: Internal Medicine

## 2018-02-11 VITALS — BP 120/82 | HR 73 | Temp 98.3°F | Resp 14 | Ht 72.0 in | Wt 177.5 lb

## 2018-02-11 DIAGNOSIS — R05 Cough: Secondary | ICD-10-CM

## 2018-02-11 DIAGNOSIS — Z23 Encounter for immunization: Secondary | ICD-10-CM

## 2018-02-11 DIAGNOSIS — K219 Gastro-esophageal reflux disease without esophagitis: Secondary | ICD-10-CM

## 2018-02-11 DIAGNOSIS — Z0000 Encounter for general adult medical examination without abnormal findings: Secondary | ICD-10-CM

## 2018-02-11 DIAGNOSIS — E7800 Pure hypercholesterolemia, unspecified: Secondary | ICD-10-CM | POA: Diagnosis not present

## 2018-02-11 DIAGNOSIS — R059 Cough, unspecified: Secondary | ICD-10-CM

## 2018-02-11 DIAGNOSIS — R319 Hematuria, unspecified: Secondary | ICD-10-CM

## 2018-02-11 DIAGNOSIS — Z1159 Encounter for screening for other viral diseases: Secondary | ICD-10-CM

## 2018-02-11 MED ORDER — OMEPRAZOLE 40 MG PO CPDR
40.0000 mg | DELAYED_RELEASE_CAPSULE | Freq: Every day | ORAL | 2 refills | Status: DC
Start: 2018-02-11 — End: 2018-11-28

## 2018-02-11 NOTE — Progress Notes (Signed)
Patient ID: Howard Berry, male   DOB: Nov 14, 1960, 57 y.o.   MRN: [id number]   Subjective:    Patient ID: Howard Berry, male    DOB: [birth year], 57 y.o.   MRN: [id number]  HPI  Patient here for his physical exam.  Saw pulmonary 01/14/18 for chronic bronchitis.  Started on trilogy.  Also concern regarding persistent cough.  Was advised to continue on omeprazole.  After being on trilogy for several days symptoms improved.  Reports no cough now.  No congestion.  No sob.  No chest pain.  No acid reflux.  No abdominal pain.  Bowels moving.  Discussed immunizations.  Will call with time he can come in for fasting labs.     Past Medical History:  Diagnosis Date  . GERD (gastroesophageal reflux disease)    EGD (2007) - erosive duodenitis, reflux esophagitis, multiple gastric polyps  required dilatation.  . Hypercholesterolemia   . Left axis deviation    noted on prior EKG  . Recurrent sinusitis    Past Surgical History:  Procedure Laterality Date  . CHOLECYSTECTOMY  10/02/12   emergency surgery  . [place]  . TONSILLECTOMY  1966   Family History  Problem Relation Age of Onset  . Heart disease Father   . Throat cancer Father   . Lung cancer Father   . Thyroid disease Mother   . Hematuria Mother   . Heart disease Maternal Grandmother   . Heart disease Paternal Grandfather        MI (died age 42)  . Colon cancer Neg Hx   . Prostate cancer Neg Hx    Social History   Socioeconomic History  . Marital status: Married    Spouse name: Not on file  . Number of children: Not on file  . Years of education: Not on file  . Highest education level: Not on file  Occupational History  . Not on file  Social Needs  . Financial resource strain: Not on file  . Food insecurity:    Worry: Not on file    Inability: Not on file  . Transportation needs:    Medical: Not on file    Non-medical: Not on file  Tobacco Use  . Smoking status: Never Smoker  . Smokeless tobacco: Current User    Types: Chew  Substance and Sexual Activity  . Alcohol use: No    Alcohol/week: 0.0 standard drinks  . Drug use: No  . Sexual activity: Not on file  Lifestyle  . Physical activity:    Days per week: Not on file    Minutes per session: Not on file  . Stress: Not on file  Relationships  . Social connections:    Talks on phone: Not on file    Gets together: Not on file    Attends religious service: Not on file    Active member of club or organization: Not on file    Attends meetings of clubs or organizations: Not on file    Relationship status: Not on file  Other Topics Concern  . Not on file  Social History Narrative  . Not on file    Outpatient Encounter Medications as of 02/11/2018  Medication Sig  . albuterol (PROVENTIL HFA;VENTOLIN HFA) 108 (90 Base) MCG/ACT inhaler Inhale 2 puffs into the lungs every 6 (six) hours as needed for wheezing or shortness of breath.  . Fluticasone-Umeclidin-Vilant (TRELEGY ELLIPTA) 100-62.5-25 MCG/INH AEPB Inhale 1 puff into the lungs daily.  Marland Kitchen  omeprazole (PRILOSEC) 40 MG capsule Take 1 capsule (40 mg total) by mouth daily.  . [DISCONTINUED] omeprazole (PRILOSEC) 40 MG capsule Take 1 capsule (40 mg total) by mouth daily.   No facility-administered encounter medications on file as of 02/11/2018.     Review of Systems  Constitutional: Negative for appetite change and unexpected weight change.  HENT: Negative for congestion and sinus pressure.   Eyes: Negative for pain and visual disturbance.  Respiratory: Negative for cough, chest tightness and shortness of breath.   Cardiovascular: Negative for chest pain, palpitations and leg swelling.  Gastrointestinal: Negative for abdominal pain, diarrhea and nausea.  Genitourinary: Negative for difficulty urinating and dysuria.  Musculoskeletal: Negative for joint swelling and myalgias.  Skin: Negative for rash and wound.  Neurological: Negative for dizziness, light-headedness and headaches.    Hematological: Negative for adenopathy. Does not bruise/bleed easily.  Psychiatric/Behavioral: Negative for decreased concentration and dysphoric mood.       Objective:     Blood pressure rechecked by me:  120/82  Physical Exam  Constitutional: He is oriented to person, place, and time. He appears well-developed and well-nourished. No distress.  HENT:  Head: Normocephalic and atraumatic.  Nose: Nose normal.  Mouth/Throat: Oropharynx is clear and moist. No oropharyngeal exudate.  Eyes: Conjunctivae are normal. Right eye exhibits no discharge. Left eye exhibits no discharge.  Neck: Neck supple. No thyromegaly present.  Cardiovascular: Normal rate and regular rhythm.  Pulmonary/Chest: Breath sounds normal. No respiratory distress. He has no wheezes.  Abdominal: Soft. Bowel sounds are normal. There is no tenderness.  Genitourinary:  Genitourinary Comments: Not performed.   Musculoskeletal: He exhibits no edema or tenderness.  Lymphadenopathy:    He has no cervical adenopathy.  Neurological: He is alert and oriented to person, place, and time.  Skin: No rash noted. No erythema.  Psychiatric: He has a normal mood and affect. His behavior is normal.    BP 120/82   Pulse 73   Temp 98.3 F (36.8 C) (Oral)   Resp 14   Ht 6' (1.829 m)   Wt 177 lb 8 oz (80.5 kg)   SpO2 98%   BMI 24.07 kg/m  Wt Readings from Last 3 Encounters:  02/11/18 177 lb 8 oz (80.5 kg)  01/14/18 178 lb (80.7 kg)  08/27/17 182 lb (82.6 kg)     Lab Results  Component Value Date   WBC 7.0 08/07/2017   HGB 15.2 08/07/2017   HCT 44.2 08/07/2017   PLT 230.0 08/07/2017   GLUCOSE 94 08/07/2017   CHOL 174 08/07/2017   TRIG 177.0 (H) 08/07/2017   HDL 36.20 (L) 08/07/2017   LDLDIRECT 89.0 07/11/2016   LDLCALC 102 (H) 08/07/2017   ALT 20 08/07/2017   AST 20 08/07/2017   NA 140 08/07/2017   K 4.6 08/07/2017   CL 102 08/07/2017   CREATININE 0.95 08/07/2017   BUN 16 08/07/2017   CO2 29 08/07/2017   TSH  0.98 08/07/2017   PSA 0.29 08/07/2017       Assessment & Plan:   Problem List Items Addressed This Visit    Cough    Saw pulmonary.  On triology.  Symptoms resolved.  Follow.        GERD (gastroesophageal reflux disease)    Controlled on current regimen.  Follow.      Relevant Medications   omeprazole (PRILOSEC) 40 MG capsule   Health care maintenance    Physical today 02/11/18.  Check psa with next labs.  Colonoscopy  2012.  Recommended f/u in 10 years.  Hemoccult cards given.        Hematuria    Discussed f/u with urology and further w/up.  He declines.  Follow.       Hypercholesterolemia    Low cholesterol diet and exercise.  Follow lipid pnael.        Relevant Orders   Lipid panel   Hepatic function panel   Basic metabolic panel    Other Visit Diagnoses    Need for hepatitis C screening test    -  Primary   Relevant Orders   Hepatitis C antibody   Need for immunization against influenza       Relevant Orders   Flu Vaccine QUAD 36+ mos IM (Completed)       Einar Pheasant, MD

## 2018-02-11 NOTE — Assessment & Plan Note (Signed)
Physical today 02/11/18.  Check psa with next labs.  Colonoscopy 2012.  Recommended f/u in 10 years.  Hemoccult cards given.

## 2018-02-16 ENCOUNTER — Encounter: Payer: Self-pay | Admitting: Internal Medicine

## 2018-02-16 NOTE — Assessment & Plan Note (Signed)
Controlled on current regimen.  Follow.  

## 2018-02-16 NOTE — Assessment & Plan Note (Signed)
Discussed f/u with urology and further w/up.  He declines.  Follow.

## 2018-02-16 NOTE — Assessment & Plan Note (Signed)
Low cholesterol diet and exercise.  Follow lipid pnael.

## 2018-02-16 NOTE — Assessment & Plan Note (Signed)
Saw pulmonary.  On triology.  Symptoms resolved.  Follow.

## 2018-04-08 ENCOUNTER — Other Ambulatory Visit: Payer: Self-pay | Admitting: Internal Medicine

## 2018-04-08 MED ORDER — FLUTICASONE-UMECLIDIN-VILANT 100-62.5-25 MCG/INH IN AEPB: 1.0000 | INHALATION_SPRAY | Freq: Every day | RESPIRATORY_TRACT | 3 refills | Status: DC

## 2018-04-08 NOTE — Telephone Encounter (Signed)
°*  STAT* If patient is at the pharmacy, call can be transferred to refill team.   1. Which medications need to be refilled? (please list name of each medication and dose if known) Trelegy 100-62.5-25 mcg inh 1 puff q d   2. Which pharmacy/location (including street and city if local pharmacy) is medication to be sent to?[facility]   3. Do they need a 30 day or 90 day supply? [place]

## 2018-04-09 NOTE — Telephone Encounter (Signed)
Made patient aware rx was sent on yesterday for Trelegy refill.

## 2018-04-09 NOTE — Telephone Encounter (Signed)
Please call pt regarding refill

## 2018-04-10 ENCOUNTER — Other Ambulatory Visit: Payer: Self-pay | Admitting: Internal Medicine

## 2018-04-10 ENCOUNTER — Other Ambulatory Visit: Payer: Self-pay

## 2018-04-10 NOTE — Telephone Encounter (Signed)
Pt advised to call insurance and call office back. Nothing further needed.

## 2018-04-10 NOTE — Telephone Encounter (Signed)
Please call to discuss Trelegy. Pt states his insurance company is denying coverage for this medication.

## 2018-04-10 NOTE — Telephone Encounter (Signed)
LMTCB   Pt insurance no longer covering Trelegy. Patient needs to call insurance com and find out covered alternative for Trelegy. He can then call office back and leave the name of medication. MD will call new medication in at that point. Be sure to find out what medication is going to cost him.

## 2018-04-11 MED ORDER — FLUTICASONE-UMECLIDIN-VILANT 100-62.5-25 MCG/INH IN AEPB: 1.0000 | INHALATION_SPRAY | Freq: Every day | RESPIRATORY_TRACT | 0 refills | Status: DC

## 2018-04-11 MED ORDER — FLUTICASONE-UMECLIDIN-VILANT 100-62.5-25 MCG/INH IN AEPB: 1.0000 | INHALATION_SPRAY | Freq: Every day | RESPIRATORY_TRACT | 3 refills | Status: DC

## 2018-04-11 NOTE — Addendum Note (Signed)
Addended by: Stephanie Coup on: 04/11/2018 08:59 AM   Modules accepted: Orders

## 2018-04-11 NOTE — Telephone Encounter (Signed)
Patient wife calling States that [facility]leg[facility] but patient is on his last dose Would like to know if we have samples or another option for patient to receive medication while he waits for mail order  Please call Helene Kelp at [phone] or [phone] ext [phone] her work number til 12p

## 2018-04-11 NOTE — Telephone Encounter (Addendum)
Sample provided. New rx sent to [facility].

## 2018-04-18 ENCOUNTER — Telehealth: Payer: Self-pay | Admitting: *Deleted

## 2018-04-18 MED ORDER — FLUTICASONE-UMECLIDIN-VILANT 100-62.5-25 MCG/INH IN AEPB: 1.0000 | INHALATION_SPRAY | Freq: Every day | RESPIRATORY_TRACT | 0 refills | Status: DC

## 2018-04-18 NOTE — Addendum Note (Signed)
Addended by: Stephanie Coup on: 04/18/2018 03:46 PM   Modules accepted: Orders

## 2018-04-18 NOTE — Telephone Encounter (Signed)
Patient wife calling back  States that she is unable to get any Anoro refills therefore he will not be able to use Anoro  Would like to know if there are any other options so patient is not completely without medication  Please call to discuss

## 2018-04-18 NOTE — Telephone Encounter (Signed)
States the best number to contact is [phone] ext [phone] then try cell phone

## 2018-04-18 NOTE — Telephone Encounter (Signed)
Copied from [place] [id number]. Topic: General - Other >> Apr 18, 2018 12:31 PM Mcneil, Ja-Kwan wrote: Reason for CRM: Pt wife stated pt will be going out of town for the weekend and he is out of medication until the mail order arrives. Pt wife asked if Dr Nicki Reaper has any samples of Fluticasone-Umeclidin-Vilant (TRELEGY ELLIPTA) 100-62.5-25 MCG/INH AEPB. Request call back to advise if samples are available. Cb#[phone]

## 2018-04-18 NOTE — Telephone Encounter (Signed)
Returned call to patient's spouse. [facility] has patient not shipped medication yet. He is not out yet but will be next week. Pt will use Anoro only if he runs out. Nothing further needed.

## 2018-04-18 NOTE — Telephone Encounter (Signed)
Called and advised patient wife we do not have samples of Trelegy that she could try pulmonologist office, patient stated had tried that office. Patient is going to call insurance back and try receiving medication.

## 2018-04-18 NOTE — Telephone Encounter (Signed)
Returned call and explained that we don't have a provider available this afternoon. Pt will be out of Trelegy in a few days. He had to get thru mail order and they have not shipped. Pt will be given another Trelegy sample.

## 2018-04-18 NOTE — Telephone Encounter (Signed)
Patient still waiting on Trelegy Please call wife to discuss

## 2018-05-10 ENCOUNTER — Ambulatory Visit
Admission: EM | Admit: 2018-05-10 | Discharge: 2018-05-10 | Disposition: A | Discharge status: Home / Self Care | Payer: BLUE CROSS/BLUE SHIELD | Attending: Emergency Medicine | Admitting: Emergency Medicine

## 2018-05-10 ENCOUNTER — Telehealth: Payer: Self-pay

## 2018-05-10 ENCOUNTER — Other Ambulatory Visit: Payer: Self-pay

## 2018-05-10 DIAGNOSIS — J029 Acute pharyngitis, unspecified: Secondary | ICD-10-CM | POA: Diagnosis not present

## 2018-05-10 DIAGNOSIS — Z720 Tobacco use: Secondary | ICD-10-CM

## 2018-05-10 LAB — RAPID STREP SCREEN (MED CTR MEBANE ONLY): Streptococcus, Group A Screen (Direct): NEGATIVE

## 2018-05-10 MED ORDER — MAGIC MOUTHWASH: ORAL | 0 refills | Status: DC

## 2018-05-10 NOTE — Discharge Instructions (Addendum)
SORE THROAT: The treatment of sore throat depends upon the cause; strep throat is treated with an antibiotic, while viral pharyngitis is treated with rest, pain relievers. If prescribed, finish the entire course of antibiotics .Increase rest, fluids, and OTC meds as needed . If you have any questions or concerns, please call us or stop back at any time and we will be happy to help you. If your symptoms worsen, f/u with our office or go to the ER

## 2018-05-10 NOTE — ED Provider Notes (Signed)
[facility]    CSN: [id number] Arrival date & time: 05/10/18  1032     History   Chief Complaint Chief Complaint  Patient presents with  . Sore Throat    HPI Howard Berry is a 58 y.o. male. Patient presents with 4 day history of 8/10 sore throat. Admits to temps up to 101 at onset. Denies body aches, fatigue, chills, sweats, cough, congestion. Has used OTC cough drops and sprays with some relief. Denies any other concerns today.  HPI  Past Medical History:  Diagnosis Date  . GERD (gastroesophageal reflux disease)    EGD (2007) - erosive duodenitis, reflux esophagitis, multiple gastric polyps  required dilatation.  . Hypercholesterolemia   . Left axis deviation    noted on prior EKG  . Recurrent sinusitis     Patient Active Problem List   Diagnosis Date Noted  . Cough 08/11/2017  . Health care maintenance 09/22/2014  . Lightheaded 07/30/2013  . Headache(784.0) 07/30/2013  . Atypical chest pain 07/30/2013  . Hypercholesterolemia 05/13/2012  . GERD (gastroesophageal reflux disease) 05/13/2012  . Low testosterone 05/13/2012  . Hematuria 05/13/2012    Past Surgical History:  Procedure Laterality Date  . CHOLECYSTECTOMY  10/02/12   emergency surgery  . [place]  . TONSILLECTOMY  1966       Home Medications    Prior to Admission medications   Medication Sig Start Date End Date Taking? Authorizing Provider  Fluticasone-Umeclidin-Vilant (TRELEGY ELLIPTA) 100-62.5-25 MCG/INH AEPB Inhale 1 puff into the lungs daily. 04/11/18  Yes Laverle Hobby, MD  omeprazole (PRILOSEC) 40 MG capsule Take 1 capsule (40 mg total) by mouth daily. 02/11/18  Yes Einar Pheasant, MD  albuterol (PROVENTIL HFA;VENTOLIN HFA) 108 (90 Base) MCG/ACT inhaler Inhale 2 puffs into the lungs every 6 (six) hours as needed for wheezing or shortness of breath. 08/27/17   Laverle Hobby, MD  Fluticasone-Umeclidin-Vilant (TRELEGY ELLIPTA) 100-62.5-25 MCG/INH AEPB Inhale  1 puff into the lungs daily. 04/18/18   Laverle Hobby, MD  magic mouthwash SOLN 40 ml viscous lidocaine, 40 ml Mylanta, 40 ml diphenhydramine (12.5/41ml), 40 ml prednisolone (15 mg/48ml), 40 ml distilled water  Swish gargle and spit 5-10 ml every 6 hours as needed x 3 days 05/10/18   Danton Clap, PA-C    Family History Family History  Problem Relation Age of Onset  . Heart disease Father   . Throat cancer Father   . Lung cancer Father   . Thyroid disease Mother   . Hematuria Mother   . Heart disease Maternal Grandmother   . Heart disease Paternal Grandfather        MI (died age 9)  . Colon cancer Neg Hx   . Prostate cancer Neg Hx     Social History Social History   Tobacco Use  . Smoking status: Never Smoker  . Smokeless tobacco: Current User    Types: Chew  Substance Use Topics  . Alcohol use: No    Alcohol/week: 0.0 standard drinks  . Drug use: No     Allergies   Augmentin [amoxicillin-pot clavulanate]; Chlor-trimeton [chlorpheniramine maleate]; and Penicillins   Review of Systems Review of Systems  Constitutional: Negative for chills, fatigue and fever.  HENT: Positive for sore throat. Negative for congestion, ear pain, rhinorrhea, sinus pressure, sinus pain and trouble swallowing.   Respiratory: Negative for cough, chest tightness and shortness of breath.   Gastrointestinal: Negative for abdominal pain, nausea and vomiting.  Musculoskeletal: Negative for arthralgias and  myalgias.  Skin: Negative for color change and rash.  Allergic/Immunologic: Negative for environmental allergies.  Neurological: Negative for dizziness and headaches.  Hematological: Negative for adenopathy.     Physical Exam Triage Vital Signs ED Triage Vitals  Enc Vitals Group     BP 05/10/18 1047 118/78     Pulse Rate 05/10/18 1047 82     Resp 05/10/18 1047 18     Temp 05/10/18 1047 98.3 F (36.8 C)     Temp Source 05/10/18 1047 Oral     SpO2 05/10/18 1047 100 %      Weight 05/10/18 1046 180 lb (81.6 kg)     Height 05/10/18 1046 6' (1.829 m)     Head Circumference --      Peak Flow --      Pain Score 05/10/18 1046 8     Pain Loc --      Pain Edu? --      Excl. in [place]? --    No data found.  Updated Vital Signs BP 118/78 (BP Location: Left Arm)   Pulse 82   Temp 98.3 F (36.8 C) (Oral)   Resp 18   Ht 6' (1.829 m)   Wt 180 lb (81.6 kg)   SpO2 100%   BMI 24.41 kg/m      Physical Exam Vitals signs and nursing note reviewed.  Constitutional:      General: He is not in acute distress.    Appearance: He is well-developed and normal weight. He is not ill-appearing or toxic-appearing.  HENT:     Head: Normocephalic and atraumatic.     Right Ear: Tympanic membrane and ear canal normal. Tympanic membrane is not erythematous.     Left Ear: Tympanic membrane and ear canal normal. Tympanic membrane is not erythematous.     Nose: No congestion or rhinorrhea.     Mouth/Throat:     Mouth: Mucous membranes are moist.     Pharynx: Posterior oropharyngeal erythema present. No pharyngeal swelling, oropharyngeal exudate or uvula swelling.     Tonsils: Swelling: 0 on the right. 0 on the left.  Eyes:     Conjunctiva/sclera: Conjunctivae normal.  Neck:     Musculoskeletal: Neck supple.  Cardiovascular:     Rate and Rhythm: Normal rate and regular rhythm.     Heart sounds: Normal heart sounds. No murmur.  Pulmonary:     Effort: Pulmonary effort is normal. No respiratory distress.     Breath sounds: No wheezing, rhonchi or rales.  Lymphadenopathy:     Cervical: No cervical adenopathy.  Skin:    General: Skin is warm and dry.     Findings: No erythema or rash.  Neurological:     General: No focal deficit present.     Mental Status: He is alert and oriented to person, place, and time.  Psychiatric:        Mood and Affect: Mood normal.        Behavior: Behavior normal.      UC Treatments / Results  Labs (all labs ordered are listed, but only  abnormal results are displayed) Labs Reviewed  RAPID STREP SCREEN ([facility] ONLY)  CULTURE, GROUP A STREP [facility])    EKG None  Radiology No results found.  Procedures Procedures (including critical care time)  Medications Ordered in UC Medications - No data to display  Initial Impression / Assessment and Plan / UC Course  I have reviewed the triage vital signs and the nursing  notes.  Pertinent labs & imaging results that were available during my care of the patient were reviewed by me and considered in my medical decision making (see chart for details).     Final Clinical Impressions(s) / UC Diagnoses   Final diagnoses:  Acute pharyngitis, unspecified etiology     Discharge Instructions     SORE THROAT: The treatment of sore throat depends upon the cause; strep throat is treated with an antibiotic, while viral pharyngitis is treated with rest, pain relievers. If prescribed, finish the entire course of antibiotics .Increase rest, fluids, and OTC meds as needed . If you have any questions or concerns, please call us or stop back at any time and we will be happy to help you. If your symptoms worsen, f/u with our office or go to the ER     ED Prescriptions    Medication Sig Dispense Auth. Provider   magic mouthwash SOLN  (Status: Discontinued) 40 ml viscous lidocaine, 40 ml Mylanta, 40 ml diphenhydramine (12.5/35ml), 40 ml prednisolone (15 mg/88ml), 40 ml distilled water  Swish gargle and spit 5-10 ml every 6 hours as needed x 3 days 200 mL Shaneil Yazdi B, PA-C   magic mouthwash SOLN 40 ml viscous lidocaine, 40 ml Mylanta, 40 ml diphenhydramine (12.5/42ml), 40 ml prednisolone (15 mg/29ml), 40 ml distilled water  Swish gargle and spit 5-10 ml every 6 hours as needed x 3 days 200 mL Laurene Footman B, PA-C     Controlled Substance Prescriptions [place] Controlled Substance Registry consulted? No   Gretta Cool 05/11/18 2130

## 2018-05-10 NOTE — ED Triage Notes (Signed)
Patient complains of sore throat x 4 days with fever. Patient states that he has painful swallowing.

## 2018-05-13 LAB — CULTURE, GROUP A STREP (THRC)

## 2018-05-14 ENCOUNTER — Telehealth (HOSPITAL_COMMUNITY): Payer: Self-pay | Admitting: Emergency Medicine

## 2018-05-14 ENCOUNTER — Telehealth: Payer: Self-pay | Admitting: Family Medicine

## 2018-05-14 MED ORDER — CEFDINIR 300 MG PO CAPS: 300.0000 mg | ORAL_CAPSULE | Freq: Two times a day (BID) | ORAL | 0 refills | Status: DC

## 2018-05-14 NOTE — Telephone Encounter (Signed)
Culture is positive for group A Strep germ.  Prescription for cefdinir sent in by Dr. Lacinda Axon. Pt contacted and made aware. Recheck for further evaluation if symptoms are not improving.  Verbalized understanding.

## 2018-05-14 NOTE — Telephone Encounter (Signed)
+   Strep. Rx sent in for [facility].  [facility]

## 2018-05-14 NOTE — Telephone Encounter (Signed)
+   Strep. Sending in Rx for [place].

## 2018-05-30 ENCOUNTER — Ambulatory Visit (INDEPENDENT_AMBULATORY_CARE_PROVIDER_SITE_OTHER): Payer: BLUE CROSS/BLUE SHIELD | Admitting: Internal Medicine

## 2018-05-30 ENCOUNTER — Ambulatory Visit
Admission: RE | Admit: 2018-05-30 | Discharge: 2018-05-30 | Disposition: A | Discharge status: Home / Self Care | Payer: BLUE CROSS/BLUE SHIELD | Source: Ambulatory Visit | Attending: Internal Medicine | Admitting: Internal Medicine

## 2018-05-30 ENCOUNTER — Encounter: Payer: Self-pay | Admitting: Internal Medicine

## 2018-05-30 ENCOUNTER — Other Ambulatory Visit
Admission: RE | Admit: 2018-05-30 | Discharge: 2018-05-30 | Disposition: A | Discharge status: Home / Self Care | Payer: BLUE CROSS/BLUE SHIELD | Source: Ambulatory Visit | Attending: Internal Medicine | Admitting: Internal Medicine

## 2018-05-30 VITALS — BP 100/70 | HR 96 | Resp 16 | Ht 72.0 in | Wt 177.0 lb

## 2018-05-30 DIAGNOSIS — J209 Acute bronchitis, unspecified: Secondary | ICD-10-CM

## 2018-05-30 DIAGNOSIS — J449 Chronic obstructive pulmonary disease, unspecified: Secondary | ICD-10-CM | POA: Insufficient documentation

## 2018-05-30 DIAGNOSIS — J440 Chronic obstructive pulmonary disease with acute lower respiratory infection: Secondary | ICD-10-CM | POA: Diagnosis not present

## 2018-05-30 DIAGNOSIS — R0609 Other forms of dyspnea: Secondary | ICD-10-CM | POA: Diagnosis not present

## 2018-05-30 DIAGNOSIS — J4489 Other specified chronic obstructive pulmonary disease: Secondary | ICD-10-CM

## 2018-05-30 DIAGNOSIS — R05 Cough: Secondary | ICD-10-CM | POA: Diagnosis not present

## 2018-05-30 MED ORDER — AZITHROMYCIN 250 MG PO TABS: 250.0000 mg | ORAL_TABLET | Freq: Every day | ORAL | 0 refills | Status: DC

## 2018-05-30 NOTE — Progress Notes (Signed)
[facility] Consultation      Assessment and Plan:  Chronic bronchitis with dyspnea on exertion. --continues to have symptoms of cough and chronic bronchitis.  --Will start trial of trilogy inhaler for 1 month.  He is given samples, asked to call us back in 1 month with progress.  If he is improved we will call in a prescription, if not we will send him for a lung function test and referral to gastroenterology for GERD.  GERD. --He continues to wake at night with coughing which may be due to reflux.  --Continue omeprazole. He has had his esophagus stretched in the past, he may need to be referred back to GI for endoscopy if his symptoms do not improve.  Date: 05/30/2018  MRN# [id number] Howard Berry August 24, 1960   Howard Berry is a 58 y.o. old male seen in consultation for chief complaint of:    Chief Complaint  Patient presents with  . COPD    12/31 dx with strep throat completed abx  . Cough    05/28/18 started having fever,nasal congestion and cough.  . chest congestion    HPI:  The patient is a 58 year old male, he is very active with outdoor activities.  Over the last few years he is noticed some dyspnea with heavy exertion with activities activities such as carrying a deer carcass or hiking uphill through the woods.   He comes in today as an urgent visit.  He went to the urgent care on 05/06/2018, with a positive strep, received antibiotics (omnicef). He finished the abx about a week ago.  He returns today with symptoms of fever nasal drainage increased cough and sputum production.  He continues on trelegy, he has albuterol but has not been using it.   At last visit he was noted to have continued dyspnea on heavy exertion, as well as nocturnal cough that was waking him up from sleep.  He was started on trilogy inhaler which seemed to help significantly.  We also asked him to continue omeprazole with consideration of gastro referral due to history of dysphagia  in the past requiring stretching of the esophagus.  He last smoked 10 yrs ago. He has never been diagnosed with respiratory issues in the past and normally does not have a problems with getting around.   **Imaging personally reviewed, chest x-ray 08/07/17, increased bronchovascular markings, changes of chronic bronchitis, otherwise lungs are unremarkable.  Slight right vascular prominence, which appears to have been present since previous films on 2015.  Medication:    Current Outpatient Medications:  .  albuterol (PROVENTIL HFA;VENTOLIN HFA) 108 (90 Base) MCG/ACT inhaler, Inhale 2 puffs into the lungs every 6 (six) hours as needed for wheezing or shortness of breath., Disp: 1 Inhaler, Rfl: 2 .  cefdinir (OMNICEF) 300 MG capsule, Take 1 capsule (300 mg total) by mouth 2 (two) times daily., Disp: 20 capsule, Rfl: 0 .  Fluticasone-Umeclidin-Vilant (TRELEGY ELLIPTA) 100-62.5-25 MCG/INH AEPB, Inhale 1 puff into the lungs daily., Disp: 3 each, Rfl: 3 .  Fluticasone-Umeclidin-Vilant (TRELEGY ELLIPTA) 100-62.5-25 MCG/INH AEPB, Inhale 1 puff into the lungs daily., Disp: 28 each, Rfl: 0 .  magic mouthwash SOLN, 40 ml viscous lidocaine, 40 ml Mylanta, 40 ml diphenhydramine (12.5/55ml), 40 ml prednisolone (15 mg/36ml), 40 ml distilled water  Swish gargle and spit 5-10 ml every 6 hours as needed x 3 days, Disp: 200 mL, Rfl: 0 .  omeprazole (PRILOSEC) 40 MG capsule, Take 1 capsule (40 mg total) by  mouth daily., Disp: 90 capsule, Rfl: 2   Allergies:  Augmentin [amoxicillin-pot clavulanate]; Chlor-trimeton [chlorpheniramine maleate]; and Penicillins  Review of Systems:  Constitutional: Feels well. Cardiovascular: Denies chest pain, exertional chest pain.  Pulmonary: Denies hemoptysis, pleuritic chest pain.   The remainder of systems were reviewed and were found to be negative other than what is documented in the HPI.    Physical Examination:   VS: BP 100/70 (BP Location: Left Arm, Cuff Size: Normal)    Pulse 96   Resp 16   Ht 6' (1.829 m)   Wt 177 lb (80.3 kg)   SpO2 99%   BMI 24.01 kg/m   General Appearance: No distress  Neuro:without focal findings, mental status, speech normal, alert and oriented HEENT: PERRLA, EOM intact Pulmonary: No wheezing, No rales  CardiovascularNormal S1,S2.  No m/r/g.  Abdomen: Benign, Soft, non-tender, No masses Renal:  No costovertebral tenderness  GU:  No performed at this time. Endoc: No evident thyromegaly, no signs of acromegaly or Cushing features Skin:   warm, no rashes, no ecchymosis  Extremities: normal, no cyanosis, clubbing.    LABORATORY PANEL:   CBC No results for input(s): WBC, HGB, HCT, PLT in the last 168 hours. ------------------------------------------------------------------------------------------------------------------  Chemistries  No results for input(s): NA, K, CL, CO2, GLUCOSE, BUN, CREATININE, CALCIUM, MG, AST, ALT, ALKPHOS, BILITOT in the last 168 hours.  Invalid input(s): GFRCGP ------------------------------------------------------------------------------------------------------------------  Cardiac Enzymes No results for input(s): TROPONINI in the last 168 hours. ------------------------------------------------------------  RADIOLOGY:  No results found.     Thank  you for the consultation and for allowing [facility] to assist in the care of your patient. Our recommendations are noted above.  Please contact us if we can be of further service.  Marda Stalker, M.D., F.C.C.P.  Board Certified in Internal Medicine, Pulmonary Medicine, [place], and Sleep Medicine.  [facility] Office Number: [phone]   05/30/2018

## 2018-05-30 NOTE — Patient Instructions (Addendum)
Will check a sputum culture and chest xray.  Sent script for azithromycin.

## 2018-06-04 DIAGNOSIS — L570 Actinic keratosis: Secondary | ICD-10-CM | POA: Diagnosis not present

## 2018-06-04 DIAGNOSIS — L918 Other hypertrophic disorders of the skin: Secondary | ICD-10-CM | POA: Diagnosis not present

## 2018-06-04 DIAGNOSIS — L739 Follicular disorder, unspecified: Secondary | ICD-10-CM | POA: Diagnosis not present

## 2018-06-04 DIAGNOSIS — L821 Other seborrheic keratosis: Secondary | ICD-10-CM | POA: Diagnosis not present

## 2018-06-04 DIAGNOSIS — Z1283 Encounter for screening for malignant neoplasm of skin: Secondary | ICD-10-CM | POA: Diagnosis not present

## 2018-06-25 DIAGNOSIS — J3489 Other specified disorders of nose and nasal sinuses: Secondary | ICD-10-CM | POA: Diagnosis not present

## 2018-06-25 DIAGNOSIS — H6122 Impacted cerumen, left ear: Secondary | ICD-10-CM | POA: Diagnosis not present

## 2018-06-25 DIAGNOSIS — H903 Sensorineural hearing loss, bilateral: Secondary | ICD-10-CM | POA: Diagnosis not present

## 2018-07-16 DIAGNOSIS — J329 Chronic sinusitis, unspecified: Secondary | ICD-10-CM | POA: Diagnosis not present

## 2018-07-16 DIAGNOSIS — J342 Deviated nasal septum: Secondary | ICD-10-CM | POA: Diagnosis not present

## 2018-09-23 ENCOUNTER — Other Ambulatory Visit: Admission: RE | Admit: 2018-09-23 | Payer: BLUE CROSS/BLUE SHIELD | Source: Ambulatory Visit

## 2018-09-26 ENCOUNTER — Ambulatory Visit: Admit: 2018-09-26 | Payer: BLUE CROSS/BLUE SHIELD | Admitting: Unknown Physician Specialty

## 2018-09-26 SURGERY — SINUS SURGERY, WITH IMAGING GUIDANCE
Anesthesia: General | Laterality: Right

## 2018-09-26 SURGERY — SINUS SURGERY, WITH IMAGING GUIDANCE
Anesthesia: General | Site: Nose | Laterality: Right

## 2018-11-20 ENCOUNTER — Encounter: Payer: Self-pay | Admitting: Internal Medicine

## 2018-11-28 ENCOUNTER — Other Ambulatory Visit: Payer: Self-pay | Admitting: Internal Medicine

## 2019-02-12 ENCOUNTER — Other Ambulatory Visit: Payer: Self-pay

## 2019-02-16 ENCOUNTER — Encounter: Payer: Self-pay | Admitting: Internal Medicine

## 2019-02-16 ENCOUNTER — Ambulatory Visit (INDEPENDENT_AMBULATORY_CARE_PROVIDER_SITE_OTHER): Payer: BC Managed Care – PPO | Admitting: Internal Medicine

## 2019-02-16 ENCOUNTER — Other Ambulatory Visit: Payer: Self-pay

## 2019-02-16 VITALS — BP 102/64 | HR 75 | Temp 97.5°F | Resp 16 | Ht 72.0 in | Wt 173.0 lb

## 2019-02-16 DIAGNOSIS — Z125 Encounter for screening for malignant neoplasm of prostate: Secondary | ICD-10-CM

## 2019-02-16 DIAGNOSIS — K219 Gastro-esophageal reflux disease without esophagitis: Secondary | ICD-10-CM

## 2019-02-16 DIAGNOSIS — Z23 Encounter for immunization: Secondary | ICD-10-CM

## 2019-02-16 DIAGNOSIS — Z0000 Encounter for general adult medical examination without abnormal findings: Secondary | ICD-10-CM

## 2019-02-16 DIAGNOSIS — R319 Hematuria, unspecified: Secondary | ICD-10-CM | POA: Diagnosis not present

## 2019-02-16 DIAGNOSIS — E7800 Pure hypercholesterolemia, unspecified: Secondary | ICD-10-CM | POA: Diagnosis not present

## 2019-02-16 DIAGNOSIS — Z1211 Encounter for screening for malignant neoplasm of colon: Secondary | ICD-10-CM

## 2019-02-16 DIAGNOSIS — Z1159 Encounter for screening for other viral diseases: Secondary | ICD-10-CM | POA: Diagnosis not present

## 2019-02-16 DIAGNOSIS — R059 Cough, unspecified: Secondary | ICD-10-CM

## 2019-02-16 DIAGNOSIS — R05 Cough: Secondary | ICD-10-CM

## 2019-02-16 LAB — LIPID PANEL
Cholesterol: 173 mg/dL (ref 0–200)
HDL: 46.7 mg/dL (ref >39.00)
LDL Cholesterol: 107 mg/dL — ABNORMAL HIGH (ref 0–99)
NonHDL: 126.15
Total CHOL/HDL Ratio: 4
Triglycerides: 95 mg/dL (ref 0.0–149.0)
VLDL: 19 mg/dL (ref 0.0–40.0)

## 2019-02-16 LAB — URINALYSIS, ROUTINE W REFLEX MICROSCOPIC
Bilirubin Urine: NEGATIVE
Ketones, ur: NEGATIVE
Leukocytes,Ua: NEGATIVE
Nitrite: NEGATIVE
Specific Gravity, Urine: 1.02 (ref 1.000–1.030)
Total Protein, Urine: NEGATIVE
Urine Glucose: NEGATIVE
Urobilinogen, UA: 1 (ref 0.0–1.0)
WBC, UA: NONE SEEN (ref 0–?)
pH: 7 (ref 5.0–8.0)

## 2019-02-16 LAB — COMPREHENSIVE METABOLIC PANEL
ALT: 16 U/L (ref 0–53)
AST: 15 U/L (ref 0–37)
Albumin: 4.6 g/dL (ref 3.5–5.2)
Alkaline Phosphatase: 68 U/L (ref 39–117)
BUN: 14 mg/dL (ref 6–23)
CO2: 30 mEq/L (ref 19–32)
Calcium: 9.4 mg/dL (ref 8.4–10.5)
Chloride: 103 mEq/L (ref 96–112)
Creatinine, Ser: 0.98 mg/dL (ref 0.40–1.50)
GFR: 78.3 mL/min (ref >60.00)
Glucose, Bld: 91 mg/dL (ref 70–99)
Potassium: 4.6 mEq/L (ref 3.5–5.1)
Sodium: 141 mEq/L (ref 135–145)
Total Bilirubin: 0.7 mg/dL (ref 0.2–1.2)
Total Protein: 7.2 g/dL (ref 6.0–8.3)

## 2019-02-16 LAB — CBC WITH DIFFERENTIAL/PLATELET
Basophils Absolute: 0.1 10*3/uL (ref 0.0–0.1)
Basophils Relative: 1.1 % (ref 0.0–3.0)
Eosinophils Absolute: 0.2 10*3/uL (ref 0.0–0.7)
Eosinophils Relative: 3 % (ref 0.0–5.0)
HCT: 43.5 % (ref 39.0–52.0)
Hemoglobin: 14.6 g/dL (ref 13.0–17.0)
Lymphocytes Relative: 26.2 % (ref 12.0–46.0)
Lymphs Abs: 1.6 10*3/uL (ref 0.7–4.0)
MCHC: 33.6 g/dL (ref 30.0–36.0)
MCV: 91.1 fl (ref 78.0–100.0)
Monocytes Absolute: 0.6 10*3/uL (ref 0.1–1.0)
Monocytes Relative: 9.3 % (ref 3.0–12.0)
Neutro Abs: 3.8 10*3/uL (ref 1.4–7.7)
Neutrophils Relative %: 60.4 % (ref 43.0–77.0)
Platelets: 222 10*3/uL (ref 150.0–400.0)
RBC: 4.77 Mil/uL (ref 4.22–5.81)
RDW: 13.6 % (ref 11.5–15.5)
WBC: 6.3 10*3/uL (ref 4.0–10.5)

## 2019-02-16 LAB — PSA: PSA: 0.39 ng/mL (ref 0.10–4.00)

## 2019-02-16 LAB — TSH: TSH: 0.58 u[IU]/mL (ref 0.35–4.50)

## 2019-02-16 NOTE — Progress Notes (Signed)
Patient ID: Howard Berry, male   DOB: 21-Jun-1960, 58 y.o.   MRN: [id number]   Subjective:    Patient ID: Howard Berry, male    DOB: 22-Feb-1961, 58 y.o.   MRN: [id number]  HPI  Patient here for his physical exam.  States he is doing well.  Feels good.  Previously saw Dr Tami Ribas.  Was planning for sinus surgery.  Cancelled due to covid restrictions.  Plans to reschedule.  Some change with bowel movements.  Noticed blood on one occasion.  This occurred after taking medication for sinus.  No further bleeding.  Discussed last colonoscopy 2012.  Discussed f/u with GI to see about earlier colonoscopy.  Pt agreeable.  No chest pain with increased activity or exertion.  No sob.  Congestion, cough - improved.  No acid reflux.  No abdominal pain.    Past Medical History:  Diagnosis Date  . GERD (gastroesophageal reflux disease)    EGD (2007) - erosive duodenitis, reflux esophagitis, multiple gastric polyps  required dilatation.  . Hypercholesterolemia   . Left axis deviation    noted on prior EKG  . Recurrent sinusitis    Past Surgical History:  Procedure Laterality Date  . CHOLECYSTECTOMY  10/02/12   emergency surgery  . [place]  . TONSILLECTOMY  1966   Family History  Problem Relation Age of Onset  . Heart disease Father   . Throat cancer Father   . Lung cancer Father   . Thyroid disease Mother   . Hematuria Mother   . Heart disease Maternal Grandmother   . Heart disease Paternal Grandfather        MI (died age 54)  . Colon cancer Neg Hx   . Prostate cancer Neg Hx    Social History   Socioeconomic History  . Marital status: Married    Spouse name: Not on file  . Number of children: Not on file  . Years of education: Not on file  . Highest education level: Not on file  Occupational History  . Not on file  Social Needs  . Financial resource strain: Not on file  . Food insecurity    Worry: Not on file    Inability: Not on file  . Transportation needs    Medical:  Not on file    Non-medical: Not on file  Tobacco Use  . Smoking status: Never Smoker  . Smokeless tobacco: Current User    Types: Chew  Substance and Sexual Activity  . Alcohol use: No    Alcohol/week: 0.0 standard drinks  . Drug use: No  . Sexual activity: Not on file  Lifestyle  . Physical activity    Days per week: Not on file    Minutes per session: Not on file  . Stress: Not on file  Relationships  . Social Herbalist on phone: Not on file    Gets together: Not on file    Attends religious service: Not on file    Active member of club or organization: Not on file    Attends meetings of clubs or organizations: Not on file    Relationship status: Not on file  Other Topics Concern  . Not on file  Social History Narrative  . Not on file    Outpatient Encounter Medications as of 02/16/2019  Medication Sig  . albuterol (PROVENTIL HFA;VENTOLIN HFA) 108 (90 Base) MCG/ACT inhaler Inhale 2 puffs into the lungs every 6 (six) hours as needed  for wheezing or shortness of breath.  Marland Kitchen azithromycin (ZITHROMAX) 250 MG tablet Take 1 tablet (250 mg total) by mouth daily. Take 2 tabs at once on the first day, then once daily.  . Fluticasone-Umeclidin-Vilant (TRELEGY ELLIPTA) 100-62.5-25 MCG/INH AEPB Inhale 1 puff into the lungs daily.  Marland Kitchen omeprazole (PRILOSEC) 40 MG capsule TAKE 1 CAPSULE DAILY   No facility-administered encounter medications on file as of 02/16/2019.     Review of Systems  Constitutional: Negative for appetite change and unexpected weight change.  HENT: Negative for congestion and sinus pressure.   Eyes: Negative for pain and visual disturbance.  Respiratory: Negative for cough, chest tightness and shortness of breath.   Cardiovascular: Negative for chest pain, palpitations and leg swelling.  Gastrointestinal: Negative for abdominal pain, diarrhea, nausea and vomiting.  Genitourinary: Negative for difficulty urinating and dysuria.  Musculoskeletal: Negative  for joint swelling and myalgias.  Skin: Negative for color change and rash.  Neurological: Negative for dizziness, light-headedness and headaches.  Hematological: Negative for adenopathy. Does not bruise/bleed easily.  Psychiatric/Behavioral: Negative for agitation and dysphoric mood.       Objective:    Physical Exam Constitutional:      General: He is not in acute distress.    Appearance: Normal appearance. He is well-developed.  HENT:     Head: Normocephalic and atraumatic.     Right Ear: External ear normal.     Left Ear: External ear normal.  Eyes:     General: No scleral icterus.       Right eye: No discharge.        Left eye: No discharge.     Conjunctiva/sclera: Conjunctivae normal.  Neck:     Musculoskeletal: Neck supple. No muscular tenderness.     Thyroid: No thyromegaly.  Cardiovascular:     Rate and Rhythm: Normal rate and regular rhythm.  Pulmonary:     Effort: No respiratory distress.     Breath sounds: Normal breath sounds. No wheezing.  Abdominal:     General: Bowel sounds are normal.     Palpations: Abdomen is soft.     Tenderness: There is no abdominal tenderness.  Genitourinary:    Comments: Not performed.  Musculoskeletal:        General: No swelling or tenderness.  Lymphadenopathy:     Cervical: No cervical adenopathy.  Skin:    General: Skin is dry.     Findings: No erythema or rash.  Neurological:     Mental Status: He is alert and oriented to person, place, and time.  Psychiatric:        Mood and Affect: Mood normal.        Behavior: Behavior normal.     BP 102/64   Pulse 75   Temp (!) 97.5 F (36.4 C)   Resp 16   Ht 6' (1.829 m)   Wt 173 lb (78.5 kg)   SpO2 97%   BMI 23.46 kg/m  Wt Readings from Last 3 Encounters:  02/16/19 173 lb (78.5 kg)  05/30/18 177 lb (80.3 kg)  05/10/18 180 lb (81.6 kg)     Lab Results  Component Value Date   WBC 6.3 02/16/2019   HGB 14.6 02/16/2019   HCT 43.5 02/16/2019   PLT 222.0 02/16/2019    GLUCOSE 91 02/16/2019   CHOL 173 02/16/2019   TRIG 95.0 02/16/2019   HDL 46.70 02/16/2019   LDLDIRECT 89.0 07/11/2016   LDLCALC 107 (H) 02/16/2019   ALT 16 02/16/2019  AST 15 02/16/2019   NA 141 02/16/2019   K 4.6 02/16/2019   CL 103 02/16/2019   CREATININE 0.98 02/16/2019   BUN 14 02/16/2019   CO2 30 02/16/2019   TSH 0.58 02/16/2019   PSA 0.39 02/16/2019    Dg Chest 2 View  Result Date: 05/30/2018 CLINICAL DATA:  Cough and bronchitis EXAM: CHEST - 2 VIEW COMPARISON:  08/07/2017 FINDINGS: No focal airspace disease or pleural effusion. Normal cardiomediastinal silhouette. Mild bronchitic changes. No pneumothorax. IMPRESSION: Bronchitic changes without focal pulmonary infiltrate Electronically Signed   By: Donavan Foil M.D.   On: 05/30/2018 15:08       Assessment & Plan:   Problem List Items Addressed This Visit    Cough    Saw pulmonary.  On trelegy.  No problems with cough/congestion reported.  Follow.        GERD (gastroesophageal reflux disease)    Controlled on omeprazole.        Health care maintenance    Physical today 02/16/19.  Check psa today.  Colonoscopy 2012. Noticed one episode of rectal bleeding.  Refer back to GI for question of need for earlier colonoscopy.        Hematuria    Recheck urinalysis today.        Relevant Orders   Urinalysis, Routine w reflex microscopic (Completed)   Hypercholesterolemia    Low cholesterol diet and exercise.  We will follow.  Check lipid panel today.        Relevant Orders   CBC with Differential/Platelet (Completed)   Comprehensive metabolic panel (Completed)   Lipid panel (Completed)   TSH (Completed)    Other Visit Diagnoses    Colon cancer screening    -  Primary   Relevant Orders   Ambulatory referral to Gastroenterology   Prostate cancer screening       Relevant Orders   PSA (Completed)   Need for hepatitis C screening test       Relevant Orders   Hepatitis C antibody (Completed)   Need for  immunization against influenza       Relevant Orders   Flu Vaccine QUAD 36+ mos IM (Completed)       Einar Pheasant, MD

## 2019-02-17 ENCOUNTER — Encounter: Payer: Self-pay | Admitting: Internal Medicine

## 2019-02-17 LAB — HEPATITIS C ANTIBODY
Hepatitis C Ab: NONREACTIVE
SIGNAL TO CUT-OFF: 0.01 (ref <1.00)

## 2019-02-20 ENCOUNTER — Encounter: Payer: Self-pay | Admitting: Internal Medicine

## 2019-02-22 ENCOUNTER — Encounter: Payer: Self-pay | Admitting: Internal Medicine

## 2019-02-22 NOTE — Assessment & Plan Note (Signed)
Saw pulmonary.  On trelegy.  No problems with cough/congestion reported.  Follow.

## 2019-02-22 NOTE — Assessment & Plan Note (Signed)
Physical today 02/16/19.  Check psa today.  Colonoscopy 2012. Noticed one episode of rectal bleeding.  Refer back to GI for question of need for earlier colonoscopy.

## 2019-02-22 NOTE — Assessment & Plan Note (Signed)
Recheck urinalysis today.

## 2019-02-22 NOTE — Assessment & Plan Note (Signed)
Low cholesterol diet and exercise.  We will follow.  Check lipid panel today.

## 2019-02-22 NOTE — Assessment & Plan Note (Signed)
Controlled on omeprazole.   

## 2019-03-07 ENCOUNTER — Other Ambulatory Visit: Payer: Self-pay | Admitting: Internal Medicine

## 2019-03-07 ENCOUNTER — Ambulatory Visit: Admit: 2019-03-07 | Payer: BLUE CROSS/BLUE SHIELD | Admitting: Unknown Physician Specialty

## 2019-03-07 SURGERY — SINUS SURGERY, WITH IMAGING GUIDANCE
Anesthesia: General | Site: Nose | Laterality: Right

## 2019-03-17 DIAGNOSIS — J329 Chronic sinusitis, unspecified: Secondary | ICD-10-CM | POA: Diagnosis not present

## 2019-03-17 DIAGNOSIS — J342 Deviated nasal septum: Secondary | ICD-10-CM | POA: Diagnosis not present

## 2019-04-07 ENCOUNTER — Encounter: Payer: Self-pay | Admitting: Internal Medicine

## 2019-04-21 ENCOUNTER — Telehealth: Payer: Self-pay | Admitting: Internal Medicine

## 2019-04-21 ENCOUNTER — Other Ambulatory Visit: Payer: BC Managed Care – PPO

## 2019-04-21 MED ORDER — ALBUTEROL SULFATE HFA 108 (90 BASE) MCG/ACT IN AERS: 2.0000 | INHALATION_SPRAY | Freq: Four times a day (QID) | RESPIRATORY_TRACT | 0 refills | Status: DC | PRN

## 2019-04-21 NOTE — Telephone Encounter (Signed)
Called and spoke to pt, who is requesting refill on ventolin. Pt is aware that he is past due for an appt. Pt stated that he would call back to schedule an appt.  Rx for ventolin has been sent to preferred pharmacy.  Nothing further is needed.

## 2019-04-24 ENCOUNTER — Ambulatory Visit: Admit: 2019-04-24 | Payer: BC Managed Care – PPO | Admitting: Unknown Physician Specialty

## 2019-04-24 ENCOUNTER — Encounter: Payer: Self-pay | Admitting: Internal Medicine

## 2019-04-24 SURGERY — SINUS SURGERY, WITH IMAGING GUIDANCE
Anesthesia: General | Laterality: Right

## 2019-04-28 NOTE — Telephone Encounter (Signed)
Pt needs test to go back to work. Wifes quarantine is up today. Gave information to him to schedule a test. Will follow up after results.

## 2019-05-06 ENCOUNTER — Ambulatory Visit: Payer: BC Managed Care – PPO | Attending: Internal Medicine

## 2019-05-06 DIAGNOSIS — Z20828 Contact with and (suspected) exposure to other viral communicable diseases: Secondary | ICD-10-CM | POA: Diagnosis not present

## 2019-05-06 DIAGNOSIS — Z20822 Contact with and (suspected) exposure to covid-19: Secondary | ICD-10-CM

## 2019-05-07 LAB — NOVEL CORONAVIRUS, NAA: SARS-CoV-2, NAA: NOT DETECTED

## 2019-05-13 DIAGNOSIS — Z1211 Encounter for screening for malignant neoplasm of colon: Secondary | ICD-10-CM | POA: Diagnosis not present

## 2019-05-13 DIAGNOSIS — K2100 Gastro-esophageal reflux disease with esophagitis, without bleeding: Secondary | ICD-10-CM | POA: Diagnosis not present

## 2019-05-13 DIAGNOSIS — K648 Other hemorrhoids: Secondary | ICD-10-CM | POA: Diagnosis not present

## 2019-07-13 ENCOUNTER — Ambulatory Visit: Payer: BC Managed Care – PPO | Admitting: Pulmonary Disease

## 2019-07-14 ENCOUNTER — Encounter: Payer: Self-pay | Admitting: Pulmonary Disease

## 2019-07-14 ENCOUNTER — Other Ambulatory Visit: Payer: Self-pay

## 2019-07-14 ENCOUNTER — Ambulatory Visit: Payer: BC Managed Care – PPO | Admitting: Pulmonary Disease

## 2019-07-14 VITALS — BP 122/80 | HR 68 | Temp 97.8°F | Ht 72.0 in | Wt 176.0 lb

## 2019-07-14 DIAGNOSIS — J42 Unspecified chronic bronchitis: Secondary | ICD-10-CM

## 2019-07-14 DIAGNOSIS — J328 Other chronic sinusitis: Secondary | ICD-10-CM

## 2019-07-14 DIAGNOSIS — R05 Cough: Secondary | ICD-10-CM

## 2019-07-14 DIAGNOSIS — R059 Cough, unspecified: Secondary | ICD-10-CM

## 2019-07-14 NOTE — Patient Instructions (Signed)
Continue Trelegy for now  We are going to check 2 blood tests one is called alpha-1 antitrypsin and the other one is called immunoglobulins to let Dr. Nicki Reaper know so this can be drawn when you see her next month..  We are scheduling breathing tests.   We will see you in follow-up in 6 months time call sooner should any new difficulties arise.

## 2019-07-14 NOTE — Progress Notes (Signed)
    Assessment & Plan:  1. Chronic bronchitis, unspecified chronic bronchitis type (HCC) (Primary) - Pulmonary Function Test [facility] Only; Future  2. Cough  3. Other chronic sinusitis   Patient Instructions  Continue Trelegy for now  We are going to check 2 blood tests one is called alpha-1 antitrypsin and the other one is called immunoglobulins to let Dr. Glendia know so this can be drawn when you see her next month..  We are scheduling breathing tests.   We will see you in follow-up in 6 months time call sooner should any new difficulties arise.  Please note: late entry documentation due to logistical difficulties during COVID-19 pandemic. This note is filed for information purposes only, and is not intended to be used for billing, nor does it represent the full scope/nature of the visit in question. Please see any associated scanned media linked to date of encounter for additional pertinent information.  Subjective:    HPI: Howard Berry is a 59 y.o. male presenting to the pulmonology clinic on 07/14/2019 with report of: Follow-up (Former patient of Dr Verdia. He states his breathing is overall doing well. He has occ cough- non prod. He rarely uses his albuterol  inhaler. )     Outpatient Encounter Medications as of 07/14/2019  Medication Sig Note   [DISCONTINUED] albuterol  (VENTOLIN  HFA) 108 (90 Base) MCG/ACT inhaler Inhale 2 puffs into the lungs every 6 (six) hours as needed for wheezing or shortness of breath. 08/18/2020: Pt uses prn   [DISCONTINUED] omeprazole  (PRILOSEC) 40 MG capsule TAKE 1 CAPSULE DAILY    [DISCONTINUED] TRELEGY ELLIPTA  100-62.5-25 MCG/INH AEPB USE 1 INHALATION DAILY    [DISCONTINUED] azithromycin  (ZITHROMAX ) 250 MG tablet Take 1 tablet (250 mg total) by mouth daily. Take 2 tabs at once on the first day, then once daily.    No facility-administered encounter medications on file as of 07/14/2019.      Objective:   Vitals:   07/14/19 1507  BP: 122/80   Pulse: 68  Temp: 97.8 F (36.6 C)  Height: 6' (1.829 m)  Weight: 176 lb (79.8 kg)  SpO2: 98% Comment: on RA  TempSrc: Temporal  BMI (Calculated): 23.86     Physical exam documentation is limited by delayed entry of information.

## 2019-08-18 ENCOUNTER — Other Ambulatory Visit: Payer: Self-pay

## 2019-08-18 ENCOUNTER — Ambulatory Visit (INDEPENDENT_AMBULATORY_CARE_PROVIDER_SITE_OTHER): Payer: BC Managed Care – PPO | Admitting: Internal Medicine

## 2019-08-18 VITALS — BP 128/74 | HR 65 | Temp 97.4°F | Resp 16 | Ht 72.0 in | Wt 175.4 lb

## 2019-08-18 DIAGNOSIS — R059 Cough, unspecified: Secondary | ICD-10-CM

## 2019-08-18 DIAGNOSIS — R05 Cough: Secondary | ICD-10-CM | POA: Diagnosis not present

## 2019-08-18 DIAGNOSIS — E7800 Pure hypercholesterolemia, unspecified: Secondary | ICD-10-CM | POA: Diagnosis not present

## 2019-08-18 DIAGNOSIS — R0981 Nasal congestion: Secondary | ICD-10-CM

## 2019-08-18 DIAGNOSIS — K219 Gastro-esophageal reflux disease without esophagitis: Secondary | ICD-10-CM

## 2019-08-18 LAB — LIPID PANEL
Cholesterol: 187 mg/dL (ref 0–200)
HDL: 41.9 mg/dL (ref >39.00)
LDL Cholesterol: 107 mg/dL — ABNORMAL HIGH (ref 0–99)
NonHDL: 145.18
Total CHOL/HDL Ratio: 4
Triglycerides: 191 mg/dL — ABNORMAL HIGH (ref 0.0–149.0)
VLDL: 38.2 mg/dL (ref 0.0–40.0)

## 2019-08-18 LAB — COMPREHENSIVE METABOLIC PANEL
ALT: 17 U/L (ref 0–53)
AST: 15 U/L (ref 0–37)
Albumin: 4.7 g/dL (ref 3.5–5.2)
Alkaline Phosphatase: 72 U/L (ref 39–117)
BUN: 13 mg/dL (ref 6–23)
CO2: 32 mEq/L (ref 19–32)
Calcium: 9.5 mg/dL (ref 8.4–10.5)
Chloride: 101 mEq/L (ref 96–112)
Creatinine, Ser: 0.98 mg/dL (ref 0.40–1.50)
GFR: 78.17 mL/min (ref >60.00)
Glucose, Bld: 95 mg/dL (ref 70–99)
Potassium: 4.6 mEq/L (ref 3.5–5.1)
Sodium: 138 mEq/L (ref 135–145)
Total Bilirubin: 0.4 mg/dL (ref 0.2–1.2)
Total Protein: 7.5 g/dL (ref 6.0–8.3)

## 2019-08-18 NOTE — Progress Notes (Signed)
Patient ID: Howard Berry, male   DOB: 07/20/1960, 59 y.o.   MRN: [id number]   Subjective:    Patient ID: Howard Berry, male    DOB: Apr 27, 1961, 59 y.o.   MRN: [id number]  HPI This visit occurred during the SARS-CoV-2 public health emergency.  Safety protocols were in place, including screening questions prior to the visit, additional usage of staff PPE, and extensive cleaning of exam room while observing appropriate contact time as indicated for disinfecting solutions.  Patient here for a scheduled follow up.  He reports he is doing relatively well.  Stays active.  No chest pain with increased activity or exertion.  Breathing stable.  Seeing pulmonary.  Planning for PFTs.  Also planning for sinus surgery.  Has been delayed x 2.  Uses steroid nasal spray.  Is better when he is at the beach.  No acid relfux.  No abdominal pain.  Bowels moving.  Planning for f/u colonoscopy 05/2020.  Planning to get second covid vaccine next week.  Dr Patsey Berthold requested labs.  Will obtain with out labs today.   Past Medical History:  Diagnosis Date  . GERD (gastroesophageal reflux disease)    EGD (2007) - erosive duodenitis, reflux esophagitis, multiple gastric polyps  required dilatation.  . Hypercholesterolemia   . Left axis deviation    noted on prior EKG  . Recurrent sinusitis    Past Surgical History:  Procedure Laterality Date  . CHOLECYSTECTOMY  10/02/12   emergency surgery  . [place]  . TONSILLECTOMY  1966   Family History  Problem Relation Age of Onset  . Heart disease Father   . Throat cancer Father   . Lung cancer Father   . Thyroid disease Mother   . Hematuria Mother   . Heart disease Maternal Grandmother   . Heart disease Paternal Grandfather        MI (died age 22)  . Colon cancer Neg Hx   . Prostate cancer Neg Hx    Social History   Socioeconomic History  . Marital status: Married    Spouse name: Not on file  . Number of children: Not on file  . Years of education: Not  on file  . Highest education level: Not on file  Occupational History  . Not on file  Tobacco Use  . Smoking status: Never Smoker  . Smokeless tobacco: Current User    Types: Chew  Substance and Sexual Activity  . Alcohol use: No    Alcohol/week: 0.0 standard drinks  . Drug use: No  . Sexual activity: Not on file  Other Topics Concern  . Not on file  Social History Narrative  . Not on file   Social Determinants of Health   Financial Resource Strain:   . Difficulty of Paying Living Expenses:   Food Insecurity:   . Worried About Charity fundraiser in the Last Year:   . Arboriculturist in the Last Year:   Transportation Needs:   . Film/video editor (Medical):   Marland Kitchen Lack of Transportation (Non-Medical):   Physical Activity:   . Days of Exercise per Week:   . Minutes of Exercise per Session:   Stress:   . Feeling of Stress :   Social Connections:   . Frequency of Communication with Friends and Family:   . Frequency of Social Gatherings with Friends and Family:   . Attends Religious Services:   . Active Member of Clubs or Organizations:   .  Attends Archivist Meetings:   Marland Kitchen Marital Status:     Outpatient Encounter Medications as of 08/18/2019  Medication Sig  . albuterol (VENTOLIN HFA) 108 (90 Base) MCG/ACT inhaler Inhale 2 puffs into the lungs every 6 (six) hours as needed for wheezing or shortness of breath.  Marland Kitchen omeprazole (PRILOSEC) 40 MG capsule TAKE 1 CAPSULE DAILY  . TRELEGY ELLIPTA 100-62.5-25 MCG/INH AEPB USE 1 INHALATION DAILY   No facility-administered encounter medications on file as of 08/18/2019.    Review of Systems  Constitutional: Negative for appetite change and unexpected weight change.  HENT: Positive for congestion. Negative for sinus pressure.   Respiratory: Negative for chest tightness.        Breathing stable.  No increased cough.    Cardiovascular: Negative for chest pain, palpitations and leg swelling.  Gastrointestinal: Negative  for abdominal pain, diarrhea, nausea and vomiting.  Genitourinary: Negative for difficulty urinating and dysuria.  Musculoskeletal: Negative for joint swelling and myalgias.  Skin: Negative for color change and rash.  Neurological: Negative for dizziness, light-headedness and headaches.  Psychiatric/Behavioral: Negative for agitation and dysphoric mood.       Objective:    Physical Exam Constitutional:      General: He is not in acute distress.    Appearance: Normal appearance. He is well-developed.  HENT:     Head: Normocephalic and atraumatic.     Right Ear: External ear normal.     Left Ear: External ear normal.  Eyes:     General: No scleral icterus.       Right eye: No discharge.        Left eye: No discharge.     Conjunctiva/sclera: Conjunctivae normal.  Cardiovascular:     Rate and Rhythm: Normal rate and regular rhythm.  Pulmonary:     Effort: Pulmonary effort is normal. No respiratory distress.     Breath sounds: Normal breath sounds.  Abdominal:     General: Bowel sounds are normal.     Palpations: Abdomen is soft.     Tenderness: There is no abdominal tenderness.  Musculoskeletal:        General: No swelling or tenderness.     Cervical back: Neck supple. No tenderness.  Lymphadenopathy:     Cervical: No cervical adenopathy.  Skin:    Findings: No erythema or rash.  Neurological:     Mental Status: He is alert.  Psychiatric:        Mood and Affect: Mood normal.        Behavior: Behavior normal.     BP 128/74   Pulse 65   Temp (!) 97.4 F (36.3 C)   Resp 16   Ht 6' (1.829 m)   Wt 175 lb 6.4 oz (79.6 kg)   SpO2 97%   BMI 23.79 kg/m  Wt Readings from Last 3 Encounters:  08/18/19 175 lb 6.4 oz (79.6 kg)  07/14/19 176 lb (79.8 kg)  02/16/19 173 lb (78.5 kg)     Lab Results  Component Value Date   WBC 6.3 02/16/2019   HGB 14.6 02/16/2019   HCT 43.5 02/16/2019   PLT 222.0 02/16/2019   GLUCOSE 95 08/18/2019   CHOL 187 08/18/2019   TRIG 191.0  (H) 08/18/2019   HDL 41.90 08/18/2019   LDLDIRECT 89.0 07/11/2016   LDLCALC 107 (H) 08/18/2019   ALT 17 08/18/2019   AST 15 08/18/2019   NA 138 08/18/2019   K 4.6 08/18/2019   CL 101 08/18/2019  CREATININE 0.98 08/18/2019   BUN 13 08/18/2019   CO2 32 08/18/2019   TSH 0.58 02/16/2019   PSA 0.39 02/16/2019    DG Chest 2 View  Result Date: 05/30/2018 CLINICAL DATA:  Cough and bronchitis EXAM: CHEST - 2 VIEW COMPARISON:  08/07/2017 FINDINGS: No focal airspace disease or pleural effusion. Normal cardiomediastinal silhouette. Mild bronchitic changes. No pneumothorax. IMPRESSION: Bronchitic changes without focal pulmonary infiltrate Electronically Signed   By: Donavan Foil M.D.   On: 05/30/2018 15:08       Assessment & Plan:   Problem List Items Addressed This Visit    Cough - Primary    Saw pulmonary.  On trelegy.  Planning for PFTs.  Check immunoglobulins and alpha-1 antitrypsin.  Breathing stable.       Relevant Orders   Alpha-1-antitrypsin (Completed)   Immunoglobulins, QN, A/E/G/M (Completed)   GERD (gastroesophageal reflux disease)    Controlled on omeprazole.  Follow.        Hypercholesterolemia    Low cholesterol diet and exercise.  Follow lipid panel.        Relevant Orders   Comprehensive metabolic panel (Completed)   Lipid panel (Completed)   Sinus congestion    Has seen ENT.  Planning for sinus surgery.  Continue steroid nasal spray.            Einar Pheasant, MD

## 2019-08-19 ENCOUNTER — Encounter: Payer: Self-pay | Admitting: Internal Medicine

## 2019-08-19 LAB — ALPHA-1-ANTITRYPSIN: A-1 Antitrypsin, Ser: 127 mg/dL (ref 83–199)

## 2019-08-21 LAB — IMMUNOGLOBULINS A/E/G/M, SERUM
IgE (Immunoglobulin E), Serum: 178 [IU]/mL (ref 6–495)
IgG (Immunoglobin G), Serum: 1099 mg/dL (ref 603–1613)
IgM (Immunoglobulin M), Srm: 98 mg/dL (ref 20–172)
Immunoglobulin A, (IgA) QN, Serum: 414 mg/dL — ABNORMAL HIGH (ref 90–386)

## 2019-08-23 ENCOUNTER — Encounter: Payer: Self-pay | Admitting: Internal Medicine

## 2019-08-23 DIAGNOSIS — R0981 Nasal congestion: Secondary | ICD-10-CM | POA: Insufficient documentation

## 2019-08-23 NOTE — Assessment & Plan Note (Signed)
Controlled on omeprazole.  Follow.  

## 2019-08-23 NOTE — Assessment & Plan Note (Signed)
Low cholesterol diet and exercise.  Follow lipid panel.   

## 2019-08-23 NOTE — Assessment & Plan Note (Signed)
Saw pulmonary.  On trelegy.  Planning for PFTs.  Check immunoglobulins and alpha-1 antitrypsin.  Breathing stable.

## 2019-08-23 NOTE — Assessment & Plan Note (Addendum)
Has seen ENT.  Planning for sinus surgery.  Continue steroid nasal spray.

## 2019-09-03 ENCOUNTER — Other Ambulatory Visit: Payer: Self-pay | Admitting: Internal Medicine

## 2019-09-04 ENCOUNTER — Telehealth: Payer: Self-pay | Admitting: Internal Medicine

## 2019-09-04 NOTE — Telephone Encounter (Signed)
Pt dropped off paper from [facility] for FYI-placed in colored folder

## 2019-09-08 NOTE — Telephone Encounter (Signed)
Noted  

## 2019-10-29 ENCOUNTER — Other Ambulatory Visit: Payer: Self-pay

## 2019-10-29 ENCOUNTER — Encounter: Payer: Self-pay | Admitting: Pulmonary Disease

## 2019-10-29 ENCOUNTER — Ambulatory Visit: Payer: BC Managed Care – PPO | Admitting: Pulmonary Disease

## 2019-10-29 VITALS — BP 126/80 | HR 96 | Temp 98.4°F | Ht 72.0 in | Wt 172.2 lb

## 2019-10-29 DIAGNOSIS — J42 Unspecified chronic bronchitis: Secondary | ICD-10-CM

## 2019-10-29 NOTE — Patient Instructions (Signed)
What we discussed today:   Continue Trelegy 1 inhalation daily  Keep your appointment for the breathing test  See in follow-up in 6 weeks time call sooner should any new difficulties arise

## 2019-10-29 NOTE — Progress Notes (Signed)
    Assessment & Plan:  There are no diagnoses linked to this encounter.  Patient Instructions  What we discussed today:  Continue Trelegy 1 inhalation daily Keep your appointment for the breathing test See in follow-up in 6 weeks time call sooner should any new difficulties arise  Please note: late entry documentation due to logistical difficulties during COVID-19 pandemic. This note is filed for information purposes only, and is not intended to be used for billing, nor does it represent the full scope/nature of the visit in question. Please see any associated scanned media linked to date of encounter for additional pertinent information.  Subjective:    HPI: Howard Berry is a 59 y.o. male presenting to the pulmonology clinic on 10/29/2019 with report of: Follow-up (pt reports of prod cough with clear to yellow mucus mainly in the morning. )     Outpatient Encounter Medications as of 10/29/2019  Medication Sig Note   [DISCONTINUED] albuterol  (VENTOLIN  HFA) 108 (90 Base) MCG/ACT inhaler Inhale 2 puffs into the lungs every 6 (six) hours as needed for wheezing or shortness of breath. 08/18/2020: Pt uses prn   [DISCONTINUED] omeprazole  (PRILOSEC) 40 MG capsule TAKE 1 CAPSULE DAILY    [DISCONTINUED] TRELEGY ELLIPTA  100-62.5-25 MCG/INH AEPB USE 1 INHALATION DAILY (NEED APPOINTMENT FOR FURTHER REFILLS)    No facility-administered encounter medications on file as of 10/29/2019.      Objective:   Vitals:   10/29/19 1555  BP: 126/80  Pulse: 96  Temp: 98.4 F (36.9 C)  Height: 6' (1.829 m)  Weight: 172 lb 3.2 oz (78.1 kg)  SpO2: 97%  TempSrc: Temporal  BMI (Calculated): 23.35     Physical exam documentation is limited by delayed entry of information.

## 2019-11-16 ENCOUNTER — Telehealth: Payer: Self-pay

## 2019-11-16 NOTE — Telephone Encounter (Signed)
Lm to relay date/time of covid test prior to PFT.  11/18/2019 [facility]p [facility]

## 2019-11-17 NOTE — Telephone Encounter (Signed)
Lm x2 for pt.  

## 2019-11-17 NOTE — Telephone Encounter (Signed)
Patient is returning phone call. Call after 3:30 pm. Patient phone number is [phone].

## 2019-11-17 NOTE — Telephone Encounter (Signed)
Pt is aware of below message and voiced his understanding. Nothing further is needed.  

## 2019-11-18 ENCOUNTER — Other Ambulatory Visit: Payer: Self-pay

## 2019-11-18 ENCOUNTER — Other Ambulatory Visit
Admission: RE | Admit: 2019-11-18 | Discharge: 2019-11-18 | Disposition: A | Discharge status: Home / Self Care | Payer: BC Managed Care – PPO | Source: Ambulatory Visit | Attending: Pulmonary Disease | Admitting: Pulmonary Disease

## 2019-11-18 DIAGNOSIS — Z20822 Contact with and (suspected) exposure to covid-19: Secondary | ICD-10-CM | POA: Diagnosis not present

## 2019-11-18 DIAGNOSIS — Z01812 Encounter for preprocedural laboratory examination: Secondary | ICD-10-CM | POA: Insufficient documentation

## 2019-11-18 LAB — SARS CORONAVIRUS 2 (TAT 6-24 HRS): SARS Coronavirus 2: NEGATIVE

## 2019-11-19 ENCOUNTER — Ambulatory Visit: Payer: BC Managed Care – PPO | Attending: Pulmonary Disease

## 2019-11-19 DIAGNOSIS — J42 Unspecified chronic bronchitis: Secondary | ICD-10-CM | POA: Diagnosis not present

## 2019-11-19 MED ORDER — ALBUTEROL SULFATE (2.5 MG/3ML) 0.083% IN NEBU
2.5000 mg | INHALATION_SOLUTION | Freq: Once | RESPIRATORY_TRACT | Status: AC
  Administered 2019-11-19: 2.5 mg via RESPIRATORY_TRACT
  Filled 2019-11-19: qty 3

## 2019-11-23 ENCOUNTER — Other Ambulatory Visit: Payer: Self-pay | Admitting: Internal Medicine

## 2019-12-10 ENCOUNTER — Encounter: Payer: Self-pay | Admitting: Pulmonary Disease

## 2019-12-10 ENCOUNTER — Ambulatory Visit: Payer: BC Managed Care – PPO | Admitting: Pulmonary Disease

## 2019-12-10 ENCOUNTER — Other Ambulatory Visit: Payer: Self-pay

## 2019-12-10 VITALS — BP 128/74 | HR 75 | Temp 98.4°F | Ht 72.0 in | Wt 175.4 lb

## 2019-12-10 DIAGNOSIS — J4540 Moderate persistent asthma, uncomplicated: Secondary | ICD-10-CM

## 2019-12-10 NOTE — Patient Instructions (Addendum)
We are going to check for allergies and your blood  Continue Trelegy for now   We will see you in follow-up in 2 to 3 months time call sooner should any new difficulties arise

## 2020-01-22 ENCOUNTER — Other Ambulatory Visit
Admission: RE | Admit: 2020-01-22 | Discharge: 2020-01-22 | Disposition: A | Discharge status: Home / Self Care | Payer: BC Managed Care – PPO | Attending: Pulmonary Disease | Admitting: Pulmonary Disease

## 2020-01-22 DIAGNOSIS — J4540 Moderate persistent asthma, uncomplicated: Secondary | ICD-10-CM | POA: Insufficient documentation

## 2020-01-22 LAB — CBC WITH DIFFERENTIAL/PLATELET
Abs Immature Granulocytes: 0.01 10*3/uL (ref 0.00–0.07)
Basophils Absolute: 0.1 10*3/uL (ref 0.0–0.1)
Basophils Relative: 1 %
Eosinophils Absolute: 0.2 10*3/uL (ref 0.0–0.5)
Eosinophils Relative: 4 %
HCT: 42.4 % (ref 39.0–52.0)
Hemoglobin: 14.7 g/dL (ref 13.0–17.0)
Immature Granulocytes: 0 %
Lymphocytes Relative: 26 %
Lymphs Abs: 1.6 10*3/uL (ref 0.7–4.0)
MCH: 31.3 pg (ref 26.0–34.0)
MCHC: 34.7 g/dL (ref 30.0–36.0)
MCV: 90.2 fL (ref 80.0–100.0)
Monocytes Absolute: 0.7 10*3/uL (ref 0.1–1.0)
Monocytes Relative: 11 %
Neutro Abs: 3.7 10*3/uL (ref 1.7–7.7)
Neutrophils Relative %: 58 %
Platelets: 206 10*3/uL (ref 150–400)
RBC: 4.7 MIL/uL (ref 4.22–5.81)
RDW: 12.3 % (ref 11.5–15.5)
WBC: 6.4 10*3/uL (ref 4.0–10.5)
nRBC: 0 % (ref 0.0–0.2)

## 2020-01-26 LAB — ALLERGEN PANEL (27) + IGE
Alternaria Alternata IgE: <0.1 kU/L
Aspergillus Fumigatus IgE: <0.1 kU/L
Bahia Grass IgE: <0.1 kU/L
Bermuda Grass IgE: <0.1 kU/L
Cat Dander IgE: <0.1 kU/L
Cedar, Mountain IgE: <0.1 kU/L
Cladosporium Herbarum IgE: <0.1 kU/L
Cocklebur IgE: <0.1 kU/L
Cockroach, American IgE: 0.62 kU/L — AB
Common Silver Birch IgE: <0.1 kU/L
D Farinae IgE: 1.16 kU/L — AB
D Pteronyssinus IgE: 1.01 kU/L — AB
Dog Dander IgE: <0.1 kU/L
Elm, American IgE: <0.1 kU/L
Hickory, White IgE: <0.1 kU/L
IgE (Immunoglobulin E), Serum: 186 IU/mL (ref 6–495)
Johnson Grass IgE: <0.1 kU/L
Kentucky Bluegrass IgE: <0.1 kU/L
Maple/Box Elder IgE: <0.1 kU/L
Mucor Racemosus IgE: <0.1 kU/L
Oak, White IgE: <0.1 kU/L
Penicillium Chrysogen IgE: <0.1 kU/L
Pigweed, Rough IgE: <0.1 kU/L
Plantain, English IgE: <0.1 kU/L
Ragweed, Short IgE: <0.1 kU/L
Setomelanomma Rostrat: <0.1 kU/L
Timothy Grass IgE: <0.1 kU/L
White Mulberry IgE: <0.1 kU/L

## 2020-01-28 ENCOUNTER — Telehealth: Payer: Self-pay | Admitting: Pulmonary Disease

## 2020-01-28 NOTE — Telephone Encounter (Signed)
Patient is aware of results and voiced his understanding.  Nothing further needed.  

## 2020-01-28 NOTE — Telephone Encounter (Signed)
Howard Pita, MD  Claudette Head A, CMA Allergies to dust mites and to roaches. Otherwise no allergies noted.   Lm for patient.

## 2020-02-17 ENCOUNTER — Other Ambulatory Visit: Payer: Self-pay

## 2020-02-17 ENCOUNTER — Ambulatory Visit (INDEPENDENT_AMBULATORY_CARE_PROVIDER_SITE_OTHER): Payer: BC Managed Care – PPO | Admitting: Internal Medicine

## 2020-02-17 ENCOUNTER — Encounter: Payer: Self-pay | Admitting: Internal Medicine

## 2020-02-17 VITALS — BP 110/72 | HR 56 | Temp 97.8°F | Resp 16 | Ht 72.0 in | Wt 171.0 lb

## 2020-02-17 DIAGNOSIS — E7800 Pure hypercholesterolemia, unspecified: Secondary | ICD-10-CM | POA: Diagnosis not present

## 2020-02-17 DIAGNOSIS — Z0000 Encounter for general adult medical examination without abnormal findings: Secondary | ICD-10-CM | POA: Diagnosis not present

## 2020-02-17 DIAGNOSIS — R319 Hematuria, unspecified: Secondary | ICD-10-CM | POA: Diagnosis not present

## 2020-02-17 DIAGNOSIS — Z125 Encounter for screening for malignant neoplasm of prostate: Secondary | ICD-10-CM | POA: Diagnosis not present

## 2020-02-17 DIAGNOSIS — Z23 Encounter for immunization: Secondary | ICD-10-CM

## 2020-02-17 DIAGNOSIS — K219 Gastro-esophageal reflux disease without esophagitis: Secondary | ICD-10-CM

## 2020-02-17 DIAGNOSIS — R059 Cough, unspecified: Secondary | ICD-10-CM

## 2020-02-17 LAB — CBC WITH DIFFERENTIAL/PLATELET
Basophils Absolute: 0.1 10*3/uL (ref 0.0–0.1)
Basophils Relative: 0.9 % (ref 0.0–3.0)
Eosinophils Absolute: 0.2 10*3/uL (ref 0.0–0.7)
Eosinophils Relative: 3.7 % (ref 0.0–5.0)
HCT: 44.2 % (ref 39.0–52.0)
Hemoglobin: 15 g/dL (ref 13.0–17.0)
Lymphocytes Relative: 28.3 % (ref 12.0–46.0)
Lymphs Abs: 1.7 10*3/uL (ref 0.7–4.0)
MCHC: 33.8 g/dL (ref 30.0–36.0)
MCV: 91.4 fl (ref 78.0–100.0)
Monocytes Absolute: 0.5 10*3/uL (ref 0.1–1.0)
Monocytes Relative: 8.3 % (ref 3.0–12.0)
Neutro Abs: 3.6 10*3/uL (ref 1.4–7.7)
Neutrophils Relative %: 58.8 % (ref 43.0–77.0)
Platelets: 214 10*3/uL (ref 150.0–400.0)
RBC: 4.84 Mil/uL (ref 4.22–5.81)
RDW: 13.3 % (ref 11.5–15.5)
WBC: 6.1 10*3/uL (ref 4.0–10.5)

## 2020-02-17 LAB — HEPATIC FUNCTION PANEL
ALT: 15 U/L (ref 0–53)
AST: 14 U/L (ref 0–37)
Albumin: 4.5 g/dL (ref 3.5–5.2)
Alkaline Phosphatase: 64 U/L (ref 39–117)
Bilirubin, Direct: 0.1 mg/dL (ref 0.0–0.3)
Total Bilirubin: 0.6 mg/dL (ref 0.2–1.2)
Total Protein: 7.3 g/dL (ref 6.0–8.3)

## 2020-02-17 LAB — BASIC METABOLIC PANEL
BUN: 15 mg/dL (ref 6–23)
CO2: 32 mEq/L (ref 19–32)
Calcium: 9.2 mg/dL (ref 8.4–10.5)
Chloride: 103 mEq/L (ref 96–112)
Creatinine, Ser: 1.05 mg/dL (ref 0.40–1.50)
GFR: 76.81 mL/min (ref >60.00)
Glucose, Bld: 90 mg/dL (ref 70–99)
Potassium: 4.7 mEq/L (ref 3.5–5.1)
Sodium: 139 mEq/L (ref 135–145)

## 2020-02-17 LAB — URINALYSIS, ROUTINE W REFLEX MICROSCOPIC
Bilirubin Urine: NEGATIVE
Ketones, ur: NEGATIVE
Leukocytes,Ua: NEGATIVE
Nitrite: NEGATIVE
Specific Gravity, Urine: 1.025 (ref 1.000–1.030)
Total Protein, Urine: NEGATIVE
Urine Glucose: NEGATIVE
Urobilinogen, UA: 0.2 (ref 0.0–1.0)
WBC, UA: NONE SEEN (ref 0–?)
pH: 5.5 (ref 5.0–8.0)

## 2020-02-17 LAB — LIPID PANEL
Cholesterol: 179 mg/dL (ref 0–200)
HDL: 45.1 mg/dL (ref >39.00)
LDL Cholesterol: 113 mg/dL — ABNORMAL HIGH (ref 0–99)
NonHDL: 133.65
Total CHOL/HDL Ratio: 4
Triglycerides: 101 mg/dL (ref 0.0–149.0)
VLDL: 20.2 mg/dL (ref 0.0–40.0)

## 2020-02-17 LAB — PSA: PSA: 0.32 ng/mL (ref 0.10–4.00)

## 2020-02-17 LAB — TSH: TSH: 0.77 u[IU]/mL (ref 0.35–4.50)

## 2020-02-17 NOTE — Progress Notes (Signed)
Patient ID: Howard Berry, male   DOB: 1960-05-19, 59 y.o.   MRN: [id number]   Subjective:    Patient ID: Howard Berry, male    DOB: 1961-01-12, 59 y.o.   MRN: [id number]  HPI This visit occurred during the SARS-CoV-2 public health emergency.  Safety protocols were in place, including screening questions prior to the visit, additional usage of staff PPE, and extensive cleaning of exam room while observing appropriate contact time as indicated for disinfecting solutions.  Patient here for his physical exam.  He reports he is doing well.  Stays active.  No chest pain or sob reported.  Has a history of sinus issues.  Recently went out to eat.  Sat under an air conditioner.  Noticed some sinus symptoms after this.  Increased sinus pressure and nasal congestion.  Used flonase. Symptoms better now.  Reports this was approximately 10-14 days ago.  No chest congestion.  Seeing pulmonary.  On trelegy.  Breathing stable.  No acid reflux or abdominal pain reported.  Bowels moving.  Due colonoscopy 05/2020.    Past Medical History:  Diagnosis Date  . GERD (gastroesophageal reflux disease)    EGD (2007) - erosive duodenitis, reflux esophagitis, multiple gastric polyps  required dilatation.  . Hypercholesterolemia   . Left axis deviation    noted on prior EKG  . Recurrent sinusitis    Past Surgical History:  Procedure Laterality Date  . CHOLECYSTECTOMY  10/02/12   emergency surgery  . [place]  . TONSILLECTOMY  1966   Family History  Problem Relation Age of Onset  . Heart disease Father   . Throat cancer Father   . Lung cancer Father   . Thyroid disease Mother   . Hematuria Mother   . Heart disease Maternal Grandmother   . Heart disease Paternal Grandfather        MI (died age 40)  . Colon cancer Neg Hx   . Prostate cancer Neg Hx    Social History   Socioeconomic History  . Marital status: Married    Spouse name: Not on file  . Number of children: Not on file  . Years of  education: Not on file  . Highest education level: Not on file  Occupational History  . Not on file  Tobacco Use  . Smoking status: Never Smoker  . Smokeless tobacco: Current User    Types: Chew  Vaping Use  . Vaping Use: Never used  Substance and Sexual Activity  . Alcohol use: No    Alcohol/week: 0.0 standard drinks  . Drug use: No  . Sexual activity: Not on file  Other Topics Concern  . Not on file  Social History Narrative  . Not on file   Social Determinants of Health   Financial Resource Strain:   . Difficulty of Paying Living Expenses: Not on file  Food Insecurity:   . Worried About Charity fundraiser in the Last Year: Not on file  . Ran Out of Food in the Last Year: Not on file  Transportation Needs:   . Lack of Transportation (Medical): Not on file  . Lack of Transportation (Non-Medical): Not on file  Physical Activity:   . Days of Exercise per Week: Not on file  . Minutes of Exercise per Session: Not on file  Stress:   . Feeling of Stress : Not on file  Social Connections:   . Frequency of Communication with Friends and Family: Not on file  .  Frequency of Social Gatherings with Friends and Family: Not on file  . Attends Religious Services: Not on file  . Active Member of Clubs or Organizations: Not on file  . Attends Archivist Meetings: Not on file  . Marital Status: Not on file    Outpatient Encounter Medications as of 02/17/2020  Medication Sig  . albuterol (VENTOLIN HFA) 108 (90 Base) MCG/ACT inhaler Inhale 2 puffs into the lungs every 6 (six) hours as needed for wheezing or shortness of breath.  Marland Kitchen omeprazole (PRILOSEC) 40 MG capsule TAKE 1 CAPSULE DAILY  . TRELEGY ELLIPTA 100-62.5-25 MCG/INH AEPB USE 1 INHALATION DAILY (NEED APPOINTMENT FOR FURTHER REFILLS)   No facility-administered encounter medications on file as of 02/17/2020.    Review of Systems  Constitutional: Negative for appetite change and unexpected weight change.  HENT:  Negative for congestion, sinus pressure and sore throat.        No significant sinus congestion currently.    Eyes: Negative for pain and visual disturbance.  Respiratory: Negative for chest tightness and shortness of breath.   Cardiovascular: Negative for chest pain, palpitations and leg swelling.  Gastrointestinal: Negative for abdominal pain, constipation and diarrhea.  Genitourinary: Negative for difficulty urinating and dysuria.  Musculoskeletal: Negative for back pain and joint swelling.  Skin: Negative for color change and rash.  Neurological: Negative for dizziness, light-headedness and headaches.  Hematological: Negative for adenopathy. Does not bruise/bleed easily.  Psychiatric/Behavioral: Negative for agitation and dysphoric mood.       Objective:    Physical Exam Vitals reviewed.  Constitutional:      General: He is not in acute distress.    Appearance: Normal appearance. He is well-developed.  HENT:     Head: Normocephalic and atraumatic.     Right Ear: External ear normal.     Left Ear: External ear normal.     Mouth/Throat:     Pharynx: No oropharyngeal exudate.  Eyes:     General: No scleral icterus.       Right eye: No discharge.        Left eye: No discharge.     Conjunctiva/sclera: Conjunctivae normal.  Neck:     Thyroid: No thyromegaly.  Cardiovascular:     Rate and Rhythm: Normal rate and regular rhythm.  Pulmonary:     Effort: No respiratory distress.     Breath sounds: Normal breath sounds. No wheezing.  Abdominal:     General: Bowel sounds are normal.     Palpations: Abdomen is soft.     Tenderness: There is no abdominal tenderness.  Genitourinary:    Comments: Not performed.  Musculoskeletal:        General: No swelling or tenderness.     Cervical back: Neck supple. No tenderness.  Lymphadenopathy:     Cervical: No cervical adenopathy.  Skin:    Findings: No erythema or rash.  Neurological:     Mental Status: He is alert and oriented to  person, place, and time.  Psychiatric:        Mood and Affect: Mood normal.        Behavior: Behavior normal.     BP 110/72   Pulse (!) 56   Temp 97.8 F (36.6 C) (Oral)   Resp 16   Ht 6' (1.829 m)   Wt 171 lb (77.6 kg)   SpO2 99%   BMI 23.19 kg/m  Wt Readings from Last 3 Encounters:  02/17/20 171 lb (77.6 kg)  12/10/19 175 lb  6.4 oz (79.6 kg)  10/29/19 172 lb 3.2 oz (78.1 kg)     Lab Results  Component Value Date   WBC 6.1 02/17/2020   HGB 15.0 02/17/2020   HCT 44.2 02/17/2020   PLT 214.0 02/17/2020   GLUCOSE 90 02/17/2020   CHOL 179 02/17/2020   TRIG 101.0 02/17/2020   HDL 45.10 02/17/2020   LDLDIRECT 89.0 07/11/2016   LDLCALC 113 (H) 02/17/2020   ALT 15 02/17/2020   AST 14 02/17/2020   NA 139 02/17/2020   K 4.7 02/17/2020   CL 103 02/17/2020   CREATININE 1.05 02/17/2020   BUN 15 02/17/2020   CO2 32 02/17/2020   TSH 0.77 02/17/2020   PSA 0.32 02/17/2020       Assessment & Plan:   Problem List Items Addressed This Visit    Hypercholesterolemia    Low cholesterol diet and exercise.  Follow lipid panel and liver function tests.        Relevant Orders   CBC with Differential/Platelet (Completed)   TSH (Completed)   Lipid panel (Completed)   Hepatic function panel (Completed)   Basic metabolic panel (Completed)   Hematuria    Recheck urine to confirm clear.       Relevant Orders   Urinalysis, Routine w reflex microscopic (Completed)   Health care maintenance    PSA 02/17/20 - .32.  Physical today 02/17/20.  Colonoscopy 05/2010.  Due 05/2020.        GERD (gastroesophageal reflux disease)    No upper symptoms.  On omeprazole.        Cough    Saw pulmonary.  On trelegy.  Breathing doing well.  No chest congestion or increased sob.  Follow.        Other Visit Diagnoses    Need for immunization against influenza    -  Primary   Relevant Orders   Flu Vaccine QUAD 36+ mos IM (Completed)   Prostate cancer screening       Relevant Orders   PSA  (Completed)       Einar Pheasant, MD

## 2020-02-21 ENCOUNTER — Encounter: Payer: Self-pay | Admitting: Internal Medicine

## 2020-02-21 NOTE — Assessment & Plan Note (Signed)
No upper symptoms.  On omeprazole.  

## 2020-02-21 NOTE — Assessment & Plan Note (Signed)
Saw pulmonary.  On trelegy.  Breathing doing well.  No chest congestion or increased sob.  Follow.

## 2020-02-21 NOTE — Assessment & Plan Note (Signed)
PSA 02/17/20 - .32.  Physical today 02/17/20.  Colonoscopy 05/2010.  Due 05/2020.

## 2020-02-21 NOTE — Assessment & Plan Note (Signed)
Low cholesterol diet and exercise.  Follow lipid panel and liver function tests.  

## 2020-02-21 NOTE — Assessment & Plan Note (Signed)
Recheck urine to confirm clear.  

## 2020-03-15 ENCOUNTER — Other Ambulatory Visit: Payer: Self-pay

## 2020-03-15 ENCOUNTER — Ambulatory Visit (INDEPENDENT_AMBULATORY_CARE_PROVIDER_SITE_OTHER): Payer: BC Managed Care – PPO | Admitting: Pulmonary Disease

## 2020-03-15 ENCOUNTER — Encounter: Payer: Self-pay | Admitting: Pulmonary Disease

## 2020-03-15 VITALS — BP 106/78 | HR 79 | Temp 98.7°F | Ht 72.0 in | Wt 174.0 lb

## 2020-03-15 DIAGNOSIS — J4540 Moderate persistent asthma, uncomplicated: Secondary | ICD-10-CM

## 2020-03-15 DIAGNOSIS — R059 Cough, unspecified: Secondary | ICD-10-CM

## 2020-03-15 DIAGNOSIS — K219 Gastro-esophageal reflux disease without esophagitis: Secondary | ICD-10-CM | POA: Diagnosis not present

## 2020-03-15 NOTE — Progress Notes (Signed)
Subjective:    Patient ID: Howard Berry, male    DOB: May 14, 1960, 59 y.o.   MRN: [id number]  HPI Patient is a 59 year old lifelong never smoker (does use chewing tobacco) who presents for follow-up on the issue of moderate persistent asthma complication.  He also has gastroesophageal reflux disease well controlled on PPI.  Patient is currently maintained on Trelegy Ellipta 100/62.5/25.  Previously he had been noted to have eosinophilia.  Showed sensitivities to dust mites and dust as well as American cockroach.  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  This may have been where he got sensitized.  Alpha-1 antitrypsin is been normal.  PFTs on 19 November 2019 where essentially normal.  No postbronchodilator measurement done.  This data is consistent with asthma.  She notes that he is doing well on Trelegy.  Cough is well controlled.  Gastroesophageal reflux also well controlled.  He has not noticed any sputum production or hemoptysis.  When he has cough is usually dry.  He has not had shortness of breath and has been able to perform his activities of daily living without difficulties.  Able to work without issues.   Review of Systems A 10 point review of systems was performed and it is as noted above otherwise negative.  Allergies  Allergen Reactions   Augmentin [Amoxicillin-Pot Clavulanate]    Chlor-Trimeton [Chlorpheniramine Maleate]    Penicillins    Current Meds  Medication Sig   omeprazole (PRILOSEC) 40 MG capsule TAKE 1 CAPSULE DAILY   TRELEGY ELLIPTA 100-62.5-25 MCG/INH AEPB USE 1 INHALATION DAILY (NEED APPOINTMENT FOR FURTHER REFILLS)   Immunization History  Administered Date(s) Administered   Influenza,inj,Quad PF,6+ Mos 02/05/2017, 02/11/2018, 02/16/2019, 02/17/2020   PFIZER SARS-COV-2 Vaccination 08/05/2019, 08/26/2019   Social History   Tobacco Use   Smoking status:  Never Smoker   Smokeless tobacco: Current User    Types: Chew  Substance Use Topics   Alcohol use: No    Alcohol/week: 0.0 standard drinks       Objective:   Physical Exam BP 106/78 (BP Location: Left Arm, Patient Position: Sitting, Cuff Size: Normal)    Pulse 79    Temp 98.7 F (37.1 C) (Temporal)    Ht 6' (1.829 m)    Wt 174 lb (78.9 kg)    SpO2 96%    BMI 23.60 kg/m  GENERAL: Well-developed well-nourished gentleman in no acute distress.  Fully ambulatory. HEAD: Normocephalic, atraumatic.  EYES: Pupils equal, round, reactive to light.  No scleral icterus.  MOUTH: Nose/mouth/throat not examined due to masking requirements for COVID 19. NECK: Supple. No thyromegaly. Trachea midline. No JVD.  No adenopathy. PULMONARY: Good air entry bilaterally.  Faint end expiratory wheeze left upper lung zone otherwise clear.  CARDIOVASCULAR: S1 and S2. Regular rate and rhythm.  No rubs, murmurs or gallops heard. ABDOMEN: Benign. MUSCULOSKELETAL: No joint deformity, no clubbing, no edema.  NEUROLOGIC: No focal deficits, speech is fluent.  No gait disturbance.   SKIN: Intact,warm,dry.  No rashes. PSYCH: Mood and behavior normal.     Assessment & Plan:     ICD-10-CM   1. Moderate persistent asthma without complication  P95.09    Continue Trelegy as you are doing Follow-up in 6 months time patient to call sooner should any new problems arise  2. Cough  R05.9    Well-controlled on Trelegy  3. Gastroesophageal reflux disease, unspecified whether  esophagitis present  K21.9    Well-controlled on PPI   Discussion:  Patient has well-controlled moderate persistent asthma.  He is doing well on his current regimen.  We will continue the same and follow-up in 6 months time.  He is to contact us prior to that time should any new difficulties arise.  Note that this patient does not have COPD nor emphysema.  Data supports asthma as diagnosis.  Renold Don, MD [facility]   *This note was  dictated using voice recognition software/Dragon.  Despite best efforts to proofread, errors can occur which can change the meaning.  Any change was purely unintentional.

## 2020-03-15 NOTE — Patient Instructions (Signed)
You are doing very well.  Continue Trelegy.  We will see you in 6 months time call sooner should any new problems arise.

## 2020-08-01 DIAGNOSIS — K222 Esophageal obstruction: Secondary | ICD-10-CM | POA: Diagnosis not present

## 2020-08-01 DIAGNOSIS — K5730 Diverticulosis of large intestine without perforation or abscess without bleeding: Secondary | ICD-10-CM | POA: Diagnosis not present

## 2020-08-01 DIAGNOSIS — K2100 Gastro-esophageal reflux disease with esophagitis, without bleeding: Secondary | ICD-10-CM | POA: Diagnosis not present

## 2020-08-01 DIAGNOSIS — K648 Other hemorrhoids: Secondary | ICD-10-CM | POA: Diagnosis not present

## 2020-08-18 ENCOUNTER — Encounter: Payer: Self-pay | Admitting: Internal Medicine

## 2020-08-18 ENCOUNTER — Ambulatory Visit (INDEPENDENT_AMBULATORY_CARE_PROVIDER_SITE_OTHER): Payer: BC Managed Care – PPO | Admitting: Internal Medicine

## 2020-08-18 ENCOUNTER — Other Ambulatory Visit: Payer: Self-pay

## 2020-08-18 VITALS — BP 108/68 | HR 72 | Temp 96.8°F | Resp 16 | Ht 72.0 in | Wt 171.0 lb

## 2020-08-18 DIAGNOSIS — E7800 Pure hypercholesterolemia, unspecified: Secondary | ICD-10-CM

## 2020-08-18 DIAGNOSIS — K219 Gastro-esophageal reflux disease without esophagitis: Secondary | ICD-10-CM

## 2020-08-18 DIAGNOSIS — R059 Cough, unspecified: Secondary | ICD-10-CM

## 2020-08-18 LAB — HEPATIC FUNCTION PANEL
ALT: 16 U/L (ref 0–53)
AST: 15 U/L (ref 0–37)
Albumin: 4.4 g/dL (ref 3.5–5.2)
Alkaline Phosphatase: 71 U/L (ref 39–117)
Bilirubin, Direct: 0.1 mg/dL (ref 0.0–0.3)
Total Bilirubin: 0.6 mg/dL (ref 0.2–1.2)
Total Protein: 7.4 g/dL (ref 6.0–8.3)

## 2020-08-18 LAB — BASIC METABOLIC PANEL
BUN: 14 mg/dL (ref 6–23)
CO2: 31 mEq/L (ref 19–32)
Calcium: 9.5 mg/dL (ref 8.4–10.5)
Chloride: 102 mEq/L (ref 96–112)
Creatinine, Ser: 0.98 mg/dL (ref 0.40–1.50)
GFR: 83.85 mL/min (ref >60.00)
Glucose, Bld: 87 mg/dL (ref 70–99)
Potassium: 4.6 mEq/L (ref 3.5–5.1)
Sodium: 139 mEq/L (ref 135–145)

## 2020-08-18 LAB — LIPID PANEL
Cholesterol: 177 mg/dL (ref 0–200)
HDL: 46.7 mg/dL (ref >39.00)
LDL Cholesterol: 106 mg/dL — ABNORMAL HIGH (ref 0–99)
NonHDL: 130.51
Total CHOL/HDL Ratio: 4
Triglycerides: 123 mg/dL (ref 0.0–149.0)
VLDL: 24.6 mg/dL (ref 0.0–40.0)

## 2020-08-18 NOTE — Progress Notes (Signed)
Patient ID: Howard Berry, male   DOB: 1961/01/07, 60 y.o.   MRN: [id number]   Subjective:    Patient ID: Howard Berry, male    DOB: November 08, 1960, 60 y.o.   MRN: [id number]  HPI This visit occurred during the SARS-CoV-2 public health emergency.  Safety protocols were in place, including screening questions prior to the visit, additional usage of staff PPE, and extensive cleaning of exam room while observing appropriate contact time as indicated for disinfecting solutions.  Patient here for a scheduled follow up. He is seeing pulmonary - diagnosis of moderate persistent asthma. Also has a history of GERD.  Maintained on trelegy.  Feels breathing is stable.  Denies sob.  No increased cough or congestion reported.  Some minimal cough. Using flonase.  Stays active. No chest pain.  No acid reflux reported.  No abdominal pain.  Bowels moving. Colonoscopy planned for 10/2020.      Past Medical History:  Diagnosis Date  . GERD (gastroesophageal reflux disease)    EGD (2007) - erosive duodenitis, reflux esophagitis, multiple gastric polyps  required dilatation.  . Hypercholesterolemia   . Left axis deviation    noted on prior EKG  . Recurrent sinusitis    Past Surgical History:  Procedure Laterality Date  . CHOLECYSTECTOMY  10/02/12   emergency surgery  . [place]  . TONSILLECTOMY  1966   Family History  Problem Relation Age of Onset  . Heart disease Father   . Throat cancer Father   . Lung cancer Father   . Thyroid disease Mother   . Hematuria Mother   . Heart disease Maternal Grandmother   . Heart disease Paternal Grandfather        MI (died age 18)  . Colon cancer Neg Hx   . Prostate cancer Neg Hx    Social History   Socioeconomic History  . Marital status: Married    Spouse name: Not on file  . Number of children: Not on file  . Years of education: Not on file  . Highest education level: Not on file  Occupational History  . Not on file  Tobacco Use  . Smoking status:  Never Smoker  . Smokeless tobacco: Current User    Types: Chew  Vaping Use  . Vaping Use: Never used  Substance and Sexual Activity  . Alcohol use: No    Alcohol/week: 0.0 standard drinks  . Drug use: No  . Sexual activity: Not on file  Other Topics Concern  . Not on file  Social History Narrative  . Not on file   Social Determinants of Health   Financial Resource Strain: Not on file  Food Insecurity: Not on file  Transportation Needs: Not on file  Physical Activity: Not on file  Stress: Not on file  Social Connections: Not on file    Outpatient Encounter Medications as of 08/18/2020  Medication Sig  . [DISCONTINUED] albuterol (VENTOLIN HFA) 108 (90 Base) MCG/ACT inhaler Inhale 2 puffs into the lungs every 6 (six) hours as needed for wheezing or shortness of breath.  Marland Kitchen omeprazole (PRILOSEC) 40 MG capsule TAKE 1 CAPSULE DAILY  . TRELEGY ELLIPTA 100-62.5-25 MCG/INH AEPB USE 1 INHALATION DAILY (NEED APPOINTMENT FOR FURTHER REFILLS)   No facility-administered encounter medications on file as of 08/18/2020.    Review of Systems  Constitutional: Negative for appetite change and unexpected weight change.  HENT: Negative for congestion and sinus pressure.   Respiratory: Negative for chest tightness and shortness  of breath.        Minimal cough.   Cardiovascular: Negative for chest pain, palpitations and leg swelling.  Gastrointestinal: Negative for abdominal pain, diarrhea, nausea and vomiting.  Genitourinary: Negative for difficulty urinating and dysuria.  Musculoskeletal: Negative for joint swelling and myalgias.  Skin: Negative for color change and rash.  Neurological: Negative for dizziness, light-headedness and headaches.  Psychiatric/Behavioral: Negative for agitation and dysphoric mood.       Objective:    Physical Exam Vitals reviewed.  Constitutional:      General: He is not in acute distress.    Appearance: Normal appearance. He is well-developed.  HENT:      Head: Normocephalic and atraumatic.     Right Ear: External ear normal.     Left Ear: External ear normal.  Eyes:     General: No scleral icterus.       Right eye: No discharge.        Left eye: No discharge.     Conjunctiva/sclera: Conjunctivae normal.  Cardiovascular:     Rate and Rhythm: Normal rate and regular rhythm.  Pulmonary:     Effort: Pulmonary effort is normal. No respiratory distress.     Breath sounds: Normal breath sounds.  Abdominal:     General: Bowel sounds are normal.     Palpations: Abdomen is soft.     Tenderness: There is no abdominal tenderness.  Musculoskeletal:        General: No swelling or tenderness.     Cervical back: Neck supple. No tenderness.  Lymphadenopathy:     Cervical: No cervical adenopathy.  Skin:    Findings: No erythema or rash.  Neurological:     Mental Status: He is alert.  Psychiatric:        Mood and Affect: Mood normal.        Behavior: Behavior normal.     BP 108/68   Pulse 72   Temp (!) 96.8 F (36 C) (Temporal)   Resp 16   Ht 6' (1.829 m)   Wt 171 lb (77.6 kg)   SpO2 99%   BMI 23.19 kg/m  Wt Readings from Last 3 Encounters:  08/18/20 171 lb (77.6 kg)  03/15/20 174 lb (78.9 kg)  02/17/20 171 lb (77.6 kg)     Lab Results  Component Value Date   WBC 6.1 02/17/2020   HGB 15.0 02/17/2020   HCT 44.2 02/17/2020   PLT 214.0 02/17/2020   GLUCOSE 87 08/18/2020   CHOL 177 08/18/2020   TRIG 123.0 08/18/2020   HDL 46.70 08/18/2020   LDLDIRECT 89.0 07/11/2016   LDLCALC 106 (H) 08/18/2020   ALT 16 08/18/2020   AST 15 08/18/2020   NA 139 08/18/2020   K 4.6 08/18/2020   CL 102 08/18/2020   CREATININE 0.98 08/18/2020   BUN 14 08/18/2020   CO2 31 08/18/2020   TSH 0.77 02/17/2020   PSA 0.32 02/17/2020       Assessment & Plan:   Problem List Items Addressed This Visit    Cough    Seeing pulmonary.  On trelegy.  Breathing doing well.  No sob.  Minimal cough.  Follow.       GERD (gastroesophageal reflux  disease)    No upper symptoms.  On prilosec.       Hypercholesterolemia - Primary    Low cholesterol diet and exercise.  Follow lipid panel and liver function tests.        Relevant Orders   Hepatic  function panel (Completed)   Lipid panel (Completed)   Basic metabolic panel (Completed)       Einar Pheasant, MD

## 2020-08-20 ENCOUNTER — Encounter: Payer: Self-pay | Admitting: Internal Medicine

## 2020-08-20 NOTE — Assessment & Plan Note (Signed)
Seeing pulmonary.  On trelegy.  Breathing doing well.  No sob.  Minimal cough.  Follow.

## 2020-08-20 NOTE — Assessment & Plan Note (Signed)
Low cholesterol diet and exercise.  Follow lipid panel and liver function tests.  

## 2020-08-20 NOTE — Assessment & Plan Note (Signed)
No upper symptoms.  On prilosec.  

## 2020-10-07 IMAGING — CR DG CHEST 2V
1 series · 2 of 2 positions shown · non-contrast
Comparison: 08/07/2017

CLINICAL DATA: Cough and bronchitis

EXAM:
CHEST - 2 VIEW

[Series 1: dg chest 2 view · 0.14mm/px · 2 of 2 slices shown]
[im 1/2]
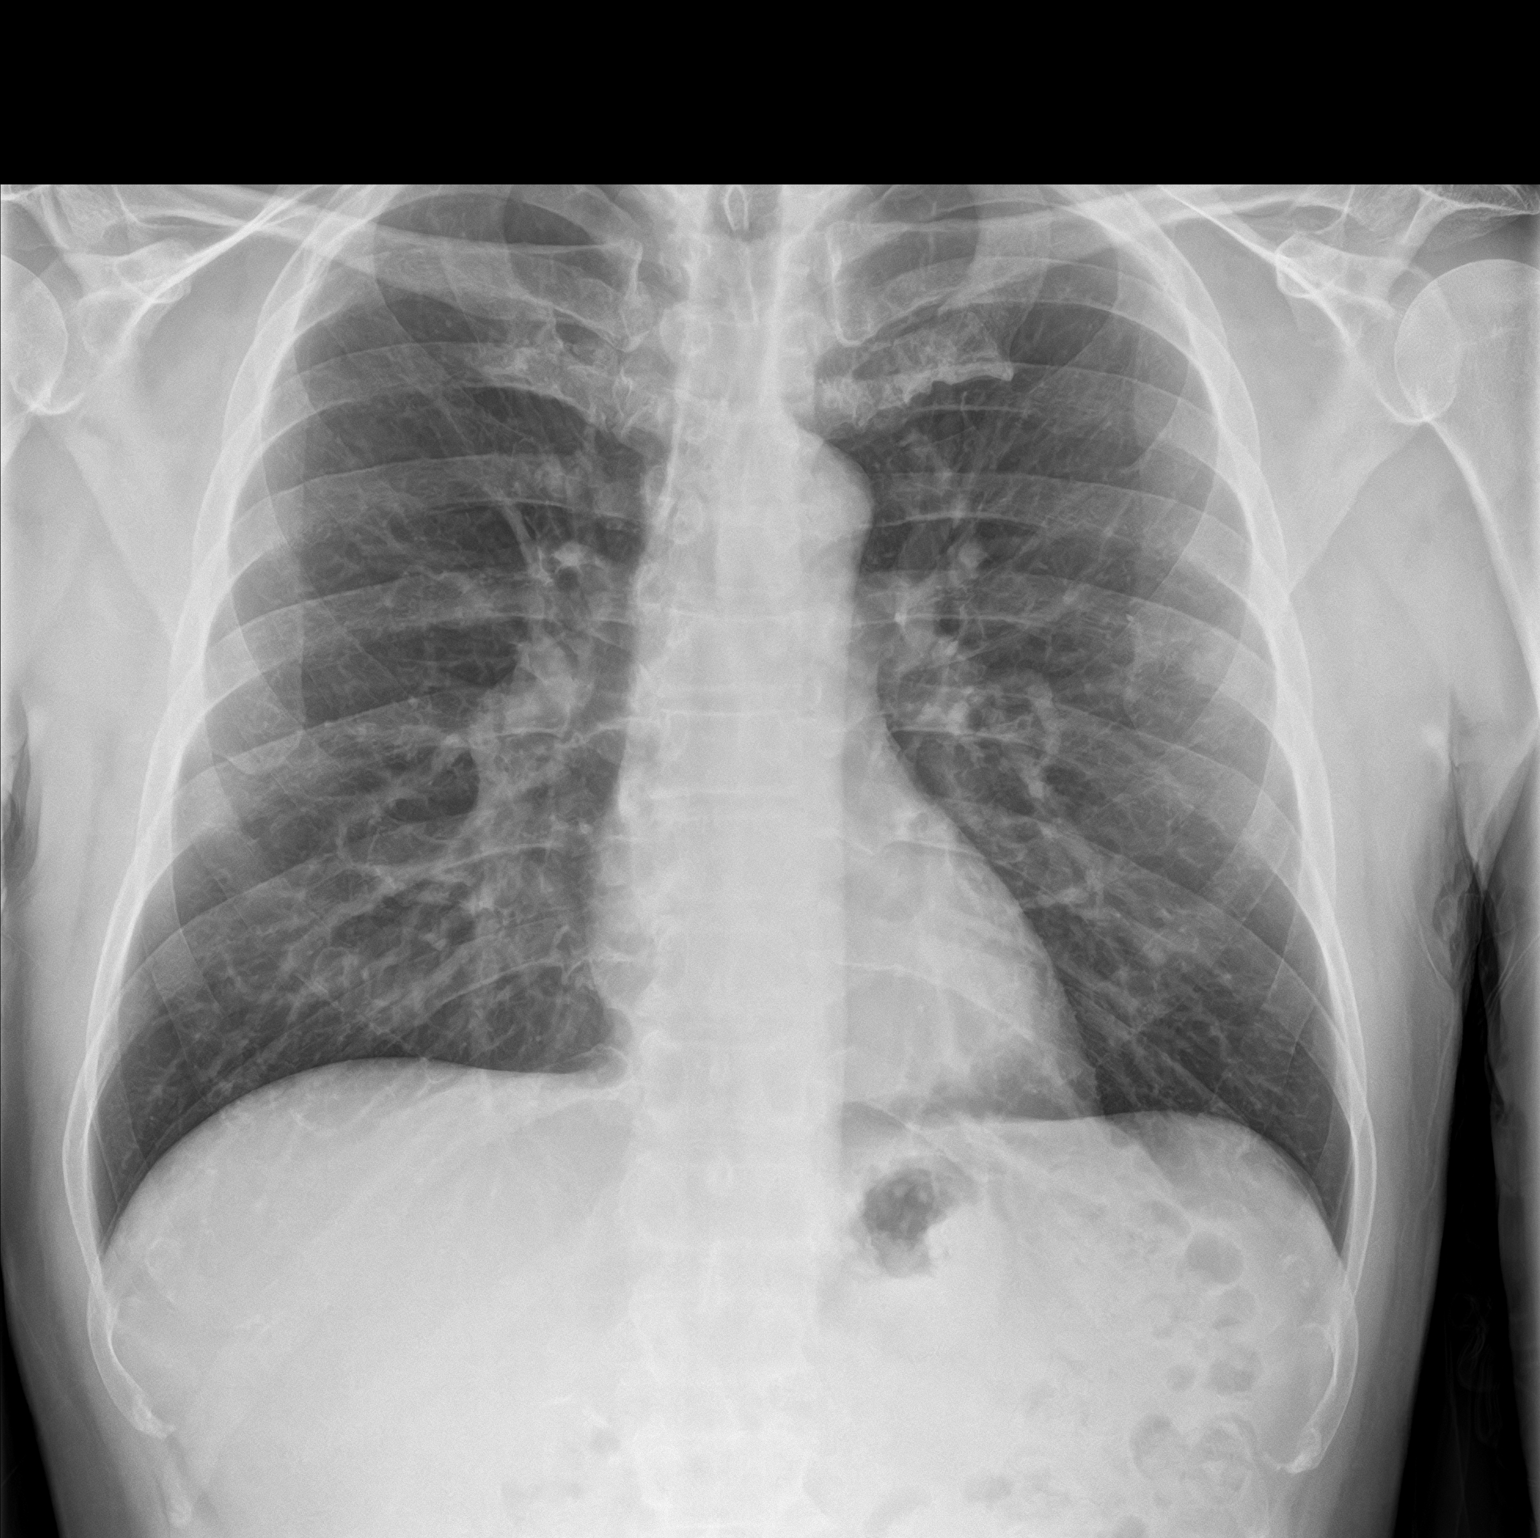
[im 2/2]
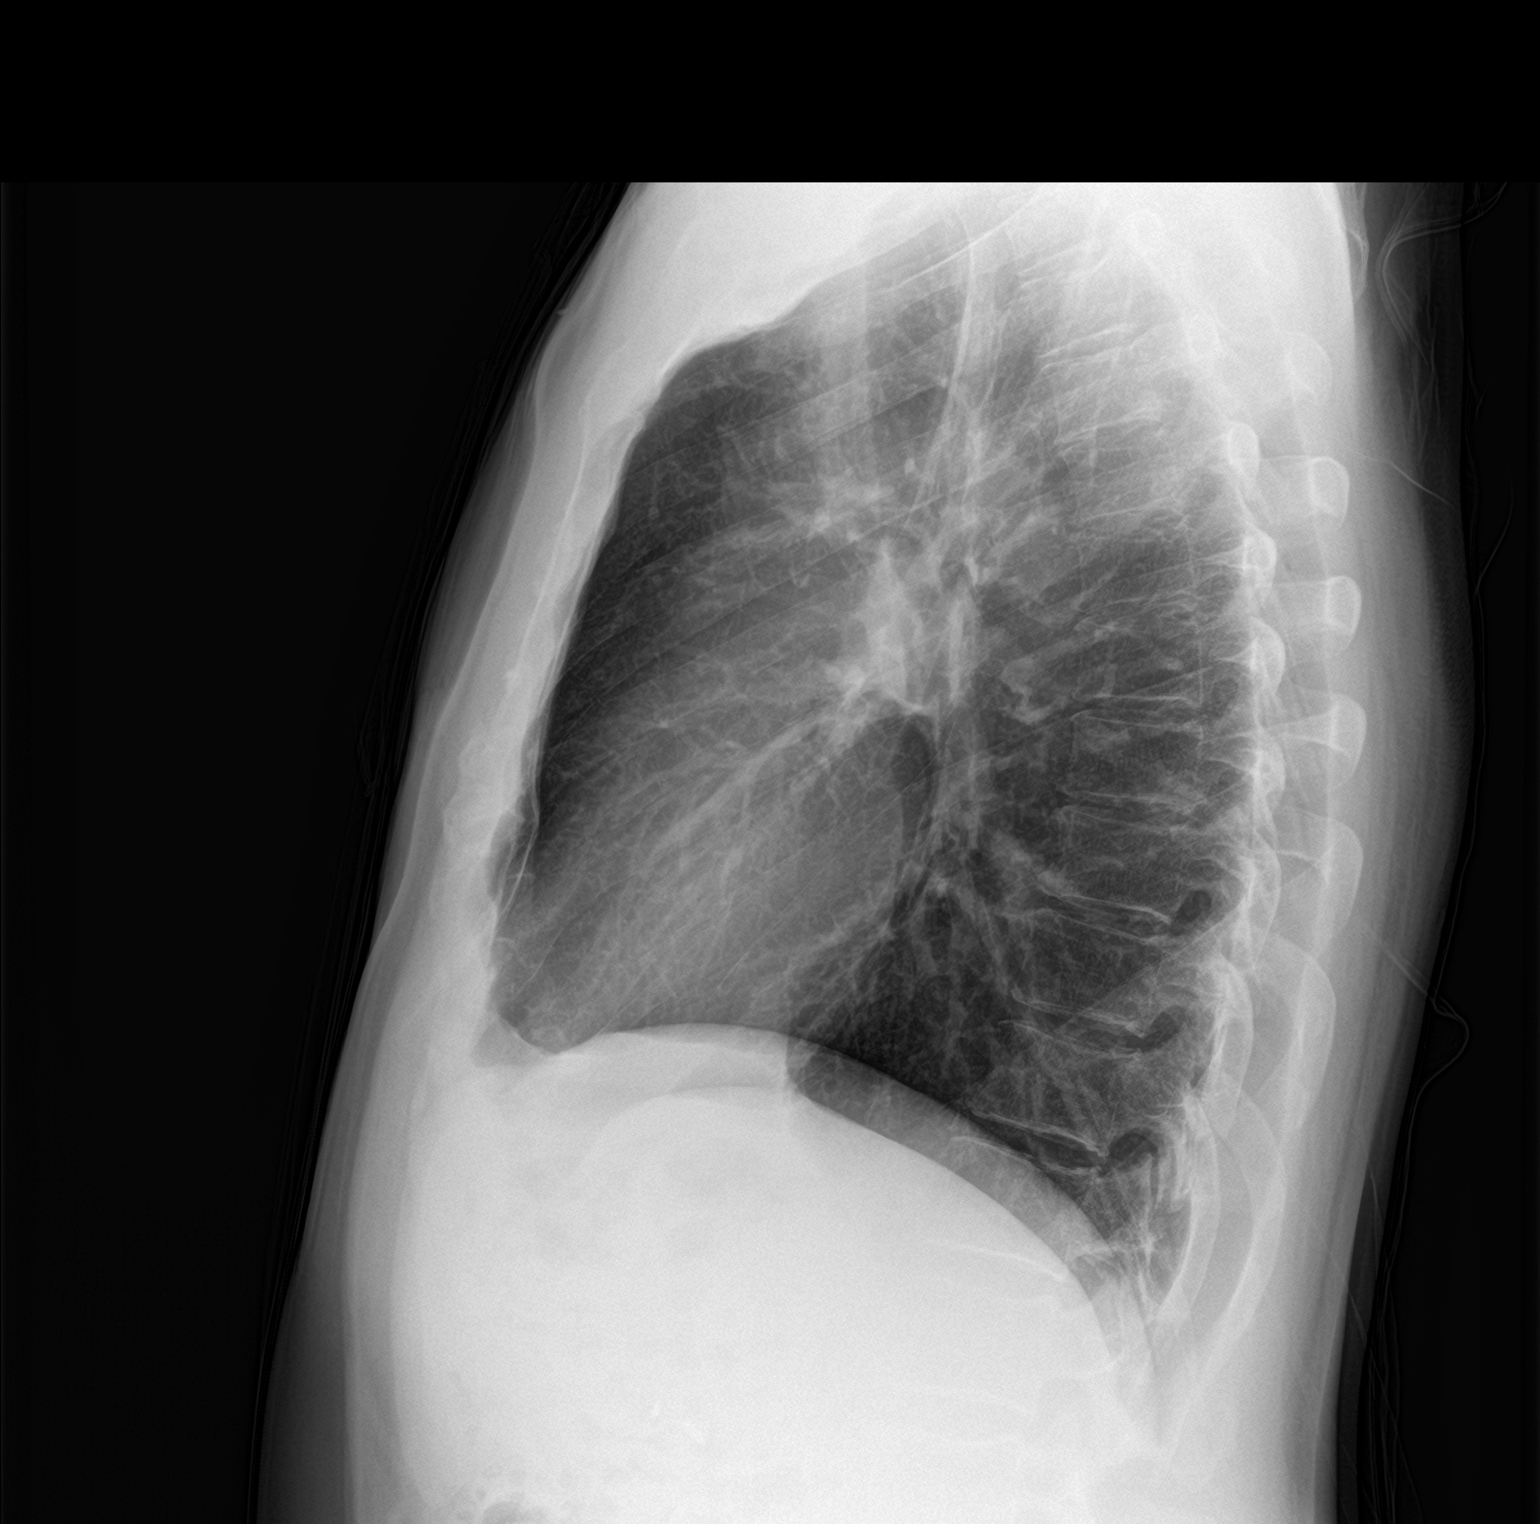

[2 of 2 positions shown; findings below may reference images not displayed]

FINDINGS: No focal airspace disease or pleural effusion. Normal
cardiomediastinal silhouette. Mild bronchitic changes. No
pneumothorax.
IMPRESSION: Bronchitic changes without focal pulmonary infiltrate

## 2020-10-10 DIAGNOSIS — K5730 Diverticulosis of large intestine without perforation or abscess without bleeding: Secondary | ICD-10-CM | POA: Diagnosis not present

## 2020-10-10 DIAGNOSIS — K640 First degree hemorrhoids: Secondary | ICD-10-CM | POA: Diagnosis not present

## 2020-10-10 DIAGNOSIS — Z1211 Encounter for screening for malignant neoplasm of colon: Secondary | ICD-10-CM | POA: Diagnosis not present

## 2020-10-10 LAB — HM COLONOSCOPY

## 2020-11-04 ENCOUNTER — Telehealth: Payer: Self-pay

## 2020-11-04 ENCOUNTER — Other Ambulatory Visit: Payer: Self-pay | Admitting: *Deleted

## 2020-11-04 ENCOUNTER — Telehealth (INDEPENDENT_AMBULATORY_CARE_PROVIDER_SITE_OTHER): Payer: BC Managed Care – PPO | Admitting: Internal Medicine

## 2020-11-04 ENCOUNTER — Other Ambulatory Visit: Payer: BC Managed Care – PPO

## 2020-11-04 ENCOUNTER — Other Ambulatory Visit: Payer: Self-pay

## 2020-11-04 ENCOUNTER — Encounter: Payer: Self-pay | Admitting: Internal Medicine

## 2020-11-04 ENCOUNTER — Telehealth: Payer: Self-pay | Admitting: Internal Medicine

## 2020-11-04 DIAGNOSIS — U071 COVID-19: Secondary | ICD-10-CM

## 2020-11-04 DIAGNOSIS — K219 Gastro-esophageal reflux disease without esophagitis: Secondary | ICD-10-CM | POA: Diagnosis not present

## 2020-11-04 DIAGNOSIS — J4520 Mild intermittent asthma, uncomplicated: Secondary | ICD-10-CM

## 2020-11-04 NOTE — Telephone Encounter (Signed)
Lab orders placed for covid test

## 2020-11-04 NOTE — Progress Notes (Signed)
Patient ID: Howard Berry, male   DOB: [birth year], 60 y.o.   MRN: [id number]   Virtual Visit via video Note  This visit type was conducted due to national recommendations for restrictions regarding the COVID-19 pandemic (e.g. social distancing).  This format is felt to be most appropriate for this patient at this time.  All issues noted in this document were discussed and addressed.  No physical exam was performed (except for noted visual exam findings with Video Visits).   I connected with Howard Berry by a video enabled telemedicine application and verified that I am speaking with the correct person using two identifiers. Location patient: home Location provider: work  Persons participating in the virtual visit: patient, provider  The limitations, risks, security and privacy concerns of performing an evaluation and management service by video and the availability of in person appointments have been discussed. It has also been discussed with the patient that there may be a patient responsible charge related to this service. The patient expressed understanding and agreed to proceed.   Reason for visit: work in appt.   HPI: Working in for positive COVID.  States symptoms started yesterday.  Developed sinus headache and stuffy nose.  Cold symptoms.  Low-grade fever.  Had chills stopped up.  Clear mucus production.  Sneezing.  No headache today.  Has taken Tylenol.  Decreased appetite.  Some nausea.  No diarrhea.  Try to stay hydrated.  Minimal cough.  No chest congestion, chest pain, chest tightness or shortness of breath.  Does have a history of mild asthma.  Using Trelegy.  Also uses Flonase at night.  Took 2 COVID test.  Positive.  Requested to come in for PCR.  Nasal swab was done today.  Results pending.   ROS: See pertinent positives and negatives per HPI.  Past Medical History:  Diagnosis Date   GERD (gastroesophageal reflux disease)    EGD (2007) - erosive duodenitis, reflux esophagitis,  multiple gastric polyps  required dilatation.   Hypercholesterolemia    Left axis deviation    noted on prior EKG   Recurrent sinusitis     Past Surgical History:  Procedure Laterality Date   CHOLECYSTECTOMY  10/02/12   emergency surgery   [place]    Family History  Problem Relation Age of Onset   Heart disease Father    Throat cancer Father    Lung cancer Father    Thyroid disease Mother    Hematuria Mother    Heart disease Maternal Grandmother    Heart disease Paternal Grandfather        MI (died age 38)   Colon cancer Neg Hx    Prostate cancer Neg Hx     SOCIAL HX: Reviewed   Current Outpatient Medications:    omeprazole (PRILOSEC) 40 MG capsule, TAKE 1 CAPSULE DAILY, Disp: 90 capsule, Rfl: 3   TRELEGY ELLIPTA 100-62.5-25 MCG/INH AEPB, USE 1 INHALATION DAILY (NEED APPOINTMENT FOR FURTHER REFILLS), Disp: 180 each, Rfl: 3  EXAM:  GENERAL: alert, oriented, appears well and in no acute distress  HEENT: atraumatic, conjunttiva clear, no obvious abnormalities on inspection of external nose and ears  NECK: normal movements of the head and neck  LUNGS: on inspection no signs of respiratory distress, breathing rate appears normal, no obvious gross SOB, gasping or wheezing  CV: no obvious cyanosis  PSYCH/NEURO: pleasant and cooperative, no obvious depression or anxiety, speech and thought processing grossly intact  ASSESSMENT AND  PLAN:  Discussed the following assessment and plan:  Problem List Items Addressed This Visit     Asthma    Has known mild asthma as outlined.  Has done well on Trelegy.  Breathing stable.  Follow-up.       COVID-19 virus infection    COVID-positive.  Symptoms as outlined.  No chest pain, chest tightness or shortness of breath.  He does have mild asthma.  Using Trelegy.  Breathing stable.  No wheezing.  Continue Flonase nasal spray as directed.  Saline nasal spray.  Mucinex as directed.  Continue Trelegy.   Given his history of mild asthma I did discuss with him regarding oral antiviral medication.  We discussed that it been FDA approved for emergency use authorization.  Discussed possible side effects.  Given his mild symptoms he elects to hold off at this time.  Will update me regarding his symptoms over the next few days.  States he may consider starting oral antiviral if the symptoms progress.  Rest.  Stay hydrated.  Call with update.       GERD (gastroesophageal reflux disease)    Reports no acid reflux symptoms.  On Prilosec.        Return if symptoms worsen or fail to improve.   I discussed the assessment and treatment plan with the patient. The patient was provided an opportunity to ask questions and all were answered. The patient agreed with the plan and demonstrated an understanding of the instructions.   The patient was advised to call back or seek an in-person evaluation if the symptoms worsen or if the condition fails to improve as anticipated.    Einar Pheasant, MD

## 2020-11-04 NOTE — Telephone Encounter (Signed)
My chart message sent for update.  

## 2020-11-04 NOTE — Telephone Encounter (Signed)
Pt appt for Covid test and virtual visit have been scheduled

## 2020-11-04 NOTE — Telephone Encounter (Signed)
I am ok with covid testing.  Per Juliann Pulse - time here is between 2 and 4.  He will also need a virtual visit with me.  Can add on virtual visit at 4:00 today.

## 2020-11-05 ENCOUNTER — Encounter: Payer: Self-pay | Admitting: Internal Medicine

## 2020-11-05 DIAGNOSIS — U071 COVID-19: Secondary | ICD-10-CM | POA: Insufficient documentation

## 2020-11-05 DIAGNOSIS — J45909 Unspecified asthma, uncomplicated: Secondary | ICD-10-CM | POA: Insufficient documentation

## 2020-11-05 LAB — SARS-COV-2, NAA 2 DAY TAT

## 2020-11-05 LAB — NOVEL CORONAVIRUS, NAA: SARS-CoV-2, NAA: DETECTED — AB

## 2020-11-05 NOTE — Assessment & Plan Note (Signed)
COVID-positive.  Symptoms as outlined.  No chest pain, chest tightness or shortness of breath.  He does have mild asthma.  Using Trelegy.  Breathing stable.  No wheezing.  Continue Flonase nasal spray as directed.  Saline nasal spray.  Mucinex as directed.  Continue Trelegy.  Given his history of mild asthma I did discuss with him regarding oral antiviral medication.  We discussed that it been FDA approved for emergency use authorization.  Discussed possible side effects.  Given his mild symptoms he elects to hold off at this time.  Will update me regarding his symptoms over the next few days.  States he may consider starting oral antiviral if the symptoms progress.  Rest.  Stay hydrated.  Call with update.

## 2020-11-05 NOTE — Assessment & Plan Note (Signed)
Reports no acid reflux symptoms.  On Prilosec.

## 2020-11-05 NOTE — Assessment & Plan Note (Signed)
Has known mild asthma as outlined.  Has done well on Trelegy.  Breathing stable.  Follow-up.

## 2020-11-08 NOTE — Telephone Encounter (Signed)
I had a visit with Howard Berry last week.  See me about this before calling him.

## 2020-11-08 NOTE — Telephone Encounter (Signed)
Confirmed that patients wife was evaluated and prescribed the antiviral

## 2020-11-17 ENCOUNTER — Other Ambulatory Visit: Payer: Self-pay | Admitting: Internal Medicine

## 2020-11-18 NOTE — Telephone Encounter (Signed)
Received a message that he was having increased sinus issues now - just got over covid.  Please call and confirm symptoms having.  Need to know if any chest pain, chest tightness, sob, etc.

## 2020-11-18 NOTE — Telephone Encounter (Signed)
LMTCB

## 2020-11-21 NOTE — Telephone Encounter (Signed)
LMTCB

## 2020-11-23 NOTE — Telephone Encounter (Signed)
My chart sent to patient to follow up

## 2020-11-28 ENCOUNTER — Other Ambulatory Visit: Payer: Self-pay | Admitting: Internal Medicine

## 2020-12-08 ENCOUNTER — Other Ambulatory Visit: Payer: Self-pay

## 2020-12-08 ENCOUNTER — Ambulatory Visit (INDEPENDENT_AMBULATORY_CARE_PROVIDER_SITE_OTHER): Payer: BC Managed Care – PPO | Admitting: Dermatology

## 2020-12-08 DIAGNOSIS — L578 Other skin changes due to chronic exposure to nonionizing radiation: Secondary | ICD-10-CM

## 2020-12-08 DIAGNOSIS — D1800 Hemangioma unspecified site: Secondary | ICD-10-CM

## 2020-12-08 DIAGNOSIS — L821 Other seborrheic keratosis: Secondary | ICD-10-CM

## 2020-12-08 DIAGNOSIS — L814 Other melanin hyperpigmentation: Secondary | ICD-10-CM

## 2020-12-08 DIAGNOSIS — S70362A Insect bite (nonvenomous), left thigh, initial encounter: Secondary | ICD-10-CM

## 2020-12-08 DIAGNOSIS — D229 Melanocytic nevi, unspecified: Secondary | ICD-10-CM

## 2020-12-08 DIAGNOSIS — D489 Neoplasm of uncertain behavior, unspecified: Secondary | ICD-10-CM | POA: Diagnosis not present

## 2020-12-08 DIAGNOSIS — D225 Melanocytic nevi of trunk: Secondary | ICD-10-CM

## 2020-12-08 DIAGNOSIS — W57XXXA Bitten or stung by nonvenomous insect and other nonvenomous arthropods, initial encounter: Secondary | ICD-10-CM | POA: Diagnosis not present

## 2020-12-08 DIAGNOSIS — Z1283 Encounter for screening for malignant neoplasm of skin: Secondary | ICD-10-CM

## 2020-12-08 MED ORDER — CLOBETASOL PROPIONATE 0.05 % EX OINT: TOPICAL_OINTMENT | CUTANEOUS | 1 refills | Status: DC

## 2020-12-08 NOTE — Patient Instructions (Addendum)
Topical steroids (such as triamcinolone, fluocinolone, fluocinonide, mometasone, clobetasol, halobetasol, betamethasone, hydrocortisone) can cause thinning and lightening of the skin if they are used for too long in the same area. Your physician has selected the right strength medicine for your problem and area affected on the body. Please use your medication only as directed by your physician to prevent side effects.   Melanoma ABCDEs  Melanoma is the most dangerous type of skin cancer, and is the leading cause of death from skin disease.  You are more likely to develop melanoma if you: Have light-colored skin, light-colored eyes, or red or blond hair Spend a lot of time in the sun Tan regularly, either outdoors or in a tanning bed Have had blistering sunburns, especially during childhood Have a close family member who has had a melanoma Have atypical moles or large birthmarks  Early detection of melanoma is key since treatment is typically straightforward and cure rates are extremely high if we catch it early.   The first sign of melanoma is often a change in a mole or a new dark spot.  The ABCDE system is a [place] of remembering the signs of melanoma.  A for asymmetry:  The two halves do not match. B for border:  The edges of the growth are irregular. C for color:  A mixture of colors are present instead of an even brown color. D for diameter:  Melanomas are usually (but not always) greater than 11m - the size of a pencil eraser. E for evolution:  The spot keeps changing in size, shape, and color.  Please check your skin once per month between visits. You can use a small mirror in front and a large mirror behind you to keep an eye on the back side or your body.   If you see any new or changing lesions before your next follow-up, please call to schedule a visit.  Please continue daily skin protection including broad spectrum sunscreen SPF 30+ to sun-exposed areas, reapplying every 2 hours as  needed when you're outdoors.   Recommend taking Heliocare sun protection supplement daily in sunny weather for additional sun protection. For maximum protection on the sunniest days, you can take up to 2 capsules of regular Heliocare OR take 1 capsule of Heliocare Ultra. For prolonged exposure (such as a full day in the sun), you can repeat your dose of the supplement 4 hours after your first dose. Heliocare [facility] be [facility] at [facility] at [email or web address]     If you have any questions or concerns for your doctor, please call our main line at [phone] press option 4 to reach your doctor's medical assistant. If no one answers, please leave a voicemail as directed and we will return your call as soon as possible. Messages left after 4 pm will be answered the following business day.   You may also send [place] message via [facility] We typically respond to MyChart messages within 1-2 business days.  For prescription refills, please ask your pharmacy to contact our office. Our fax number is [phone]  If you have an urgent issue when the clinic is closed that cannot wait until the next business day, you can page your doctor at the number below.    Please note that while we do our best to be available for urgent issues outside of office hours, we are not available 24/7.   If you have an urgent issue and are unable to reach [place] you may  choose to seek medical care at your doctor's office, retail clinic, urgent care center, or emergency room.  If you have a medical emergency, please immediately call 911 or go to the emergency department.  Pager Numbers  - Dr. Nehemiah Massed: [phone]  - Dr. Laurence Ferrari: [phone]  - Dr. Nicole Kindred: [phone]  In the event of inclement weather, please call our main line at [phone] for an update on the status of any delays or closures.  Dermatology Medication Tips: Please keep the boxes that topical medications come in in order to help keep  track of the instructions about where and how to use these. Pharmacies typically print the medication instructions only on the boxes and not directly on the medication tubes.   If your medication is too expensive, please contact our office at [phone] option 4 or send [place] a message through [place].   We are unable to tell what your co-pay for medications will be in advance as this is different depending on your insurance coverage. However, we may be able to find a substitute medication at lower cost or fill out paperwork to get insurance to cover a needed medication.   If a prior authorization is required to get your medication covered by your insurance company, please allow [place] 1-2 business days to complete this process.  Drug prices often vary depending on where the prescription is filled and some pharmacies may offer cheaper prices.  The website www.goodrx.com contains coupons for medications through different pharmacies. The prices here do not account for what the cost may be with help from insurance (it may be cheaper with your insurance), but the website can give you the price if you did not use any insurance.  - You can print the associated coupon and take it with your prescription to the pharmacy.  - You may also stop by our office during regular business hours and pick up a GoodRx coupon card.  - If you need your prescription sent electronically to a different pharmacy, notify our office through [facility] or by phone at [phone] option 4.

## 2020-12-08 NOTE — Progress Notes (Signed)
New Patient Visit  Subjective  Howard Berry is a 60 y.o. male who presents for the following: New Patient (Initial Visit) (Patient reports he is here for full body exam. He states it has been at least 2 - 3 years since being seen. He denies any concerns today at exam. He denies family or personal history of skin cancer. ).  Patient here for full body skin exam and skin cancer screening.    The following portions of the chart were reviewed this encounter and updated as appropriate:   Tobacco  Allergies  Meds  Problems  Med Hx  Surg Hx  Fam Hx      Review of Systems:  No other skin or systemic complaints except as noted in HPI or Assessment and Plan.  Objective  Well appearing patient in no apparent distress; mood and affect are within normal limits.  A full examination was performed including scalp, head, eyes, ears, nose, lips, neck, chest, axillae, abdomen, back, buttocks, bilateral upper extremities, bilateral lower extremities, hands, feet, fingers, toes, fingernails, and toenails. All findings within normal limits unless otherwise noted below.  Left Thigh - Posterior Edematous red papule  left abdomen 0.25 cm excoriated pink papule        Right Lower Back 0.4 cm dark brown macule with perifollicular dropout        Assessment & Plan   Lentigines - Scattered tan macules - Due to sun exposure - Benign-appering, observe - Recommend daily broad spectrum sunscreen SPF 30+ to sun-exposed areas, reapply every 2 hours as needed. - Call for any changes  Seborrheic Keratoses - Stuck-on, waxy, tan-brown papules and/or plaques  - Benign-appearing - Discussed benign etiology and prognosis. - Observe - Call for any changes  Melanocytic Nevi - Tan-brown and/or pink-flesh-colored symmetric macules and papules - Benign appearing on exam today - Observation - Call clinic for new or changing moles - Recommend daily use of broad spectrum spf 30+ sunscreen to  sun-exposed areas.   Hemangiomas - Red papules - Discussed benign nature - Observe - Call for any changes  Actinic Damage - Chronic condition, secondary to cumulative UV/sun exposure - diffuse scaly erythematous macules with underlying dyspigmentation - Recommend daily broad spectrum sunscreen SPF 30+ to sun-exposed areas, reapply every 2 hours as needed.  - Staying in the shade or wearing long sleeves, sun glasses (UVA+UVB protection) and wide brim hats (4-inch brim around the entire circumference of the hat) are also recommended for sun protection.  - Call for new or changing lesions.  Bug bite without infection, initial encounter Left Thigh - Posterior  Recommend clobetasol ointment apply thin layer twice daily for 2 weeks can use at other itchy areas. Avoid applying to face, groin, and axilla. Use as directed. Risk of skin atrophy with long-term use reviewed.   Topical steroids (such as triamcinolone, fluocinolone, fluocinonide, mometasone, clobetasol, halobetasol, betamethasone, hydrocortisone) can cause thinning and lightening of the skin if they are used for too long in the same area. Your physician has selected the right strength medicine for your problem and area affected on the body. Please use your medication only as directed by your physician to prevent side effects.    clobetasol ointment (TEMOVATE) 0.05 % - Left Thigh - Posterior Apply to affected itchy areas for 2 weeks. Can use at other itchy areas. Avoid applying to face, groin, and axilla. 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 Avoid applying to face, groin, and axilla. Use as directed. Risk of skin atrophy with long-term use reviewed.   If area is not completely gone patient instructed to call  Will recheck area at 3 month follow up and plan biopsy if still present.   Topical steroids (such as  triamcinolone, fluocinolone, fluocinonide, mometasone, clobetasol, halobetasol, betamethasone, hydrocortisone) can cause thinning and lightening of the skin if they are used for too long in the same area. Your physician has selected the right strength medicine for your problem and area affected on the body. Please use your medication only as directed by your physician to prevent side effects.    Nevus Right Lower Back  Benign-appearing.  Observation.  Call clinic for new or changing lesions.  Recommend daily use of broad spectrum spf 30+ sunscreen to sun-exposed areas.   Will plan to recheck at 3 month follow up  Skin cancer screening performed today.  Return in about 3 months (around 03/10/2021) for recheck moles , 1 year tbse . I, Ruthell Rummage, CMA, am acting as scribe for Forest Gleason, MD.  Documentation: I have reviewed the above documentation for accuracy and completeness, and I agree with the above.  Forest Gleason, MD

## 2020-12-20 ENCOUNTER — Encounter: Payer: Self-pay | Admitting: Dermatology

## 2021-01-31 ENCOUNTER — Telehealth: Payer: BC Managed Care – PPO | Admitting: Internal Medicine

## 2021-03-02 ENCOUNTER — Ambulatory Visit (INDEPENDENT_AMBULATORY_CARE_PROVIDER_SITE_OTHER): Payer: BC Managed Care – PPO | Admitting: Internal Medicine

## 2021-03-02 ENCOUNTER — Encounter: Payer: BC Managed Care – PPO | Admitting: Internal Medicine

## 2021-03-02 ENCOUNTER — Other Ambulatory Visit: Payer: Self-pay

## 2021-03-02 VITALS — BP 112/64 | HR 82 | Temp 98.0°F | Resp 16 | Ht 72.0 in | Wt 175.4 lb

## 2021-03-02 DIAGNOSIS — Z125 Encounter for screening for malignant neoplasm of prostate: Secondary | ICD-10-CM

## 2021-03-02 DIAGNOSIS — Z0000 Encounter for general adult medical examination without abnormal findings: Secondary | ICD-10-CM

## 2021-03-02 DIAGNOSIS — E7800 Pure hypercholesterolemia, unspecified: Secondary | ICD-10-CM

## 2021-03-02 DIAGNOSIS — J4520 Mild intermittent asthma, uncomplicated: Secondary | ICD-10-CM

## 2021-03-02 DIAGNOSIS — M25511 Pain in right shoulder: Secondary | ICD-10-CM | POA: Diagnosis not present

## 2021-03-02 DIAGNOSIS — R319 Hematuria, unspecified: Secondary | ICD-10-CM | POA: Diagnosis not present

## 2021-03-02 DIAGNOSIS — K219 Gastro-esophageal reflux disease without esophagitis: Secondary | ICD-10-CM

## 2021-03-02 DIAGNOSIS — Z23 Encounter for immunization: Secondary | ICD-10-CM

## 2021-03-02 LAB — LIPID PANEL
Cholesterol: 190 mg/dL (ref 0–200)
HDL: 45.9 mg/dL (ref >39.00)
LDL Cholesterol: 125 mg/dL — ABNORMAL HIGH (ref 0–99)
NonHDL: 143.75
Total CHOL/HDL Ratio: 4
Triglycerides: 96 mg/dL (ref 0.0–149.0)
VLDL: 19.2 mg/dL (ref 0.0–40.0)

## 2021-03-02 LAB — URINALYSIS, ROUTINE W REFLEX MICROSCOPIC
Bilirubin Urine: NEGATIVE
Ketones, ur: NEGATIVE
Leukocytes,Ua: NEGATIVE
Nitrite: NEGATIVE
Specific Gravity, Urine: 1.015 (ref 1.000–1.030)
Total Protein, Urine: NEGATIVE
Urine Glucose: NEGATIVE
Urobilinogen, UA: 0.2 (ref 0.0–1.0)
pH: 6.5 (ref 5.0–8.0)

## 2021-03-02 LAB — CBC WITH DIFFERENTIAL/PLATELET
Basophils Absolute: 0 10*3/uL (ref 0.0–0.1)
Basophils Relative: 0.8 % (ref 0.0–3.0)
Eosinophils Absolute: 0.2 10*3/uL (ref 0.0–0.7)
Eosinophils Relative: 3.2 % (ref 0.0–5.0)
HCT: 44.7 % (ref 39.0–52.0)
Hemoglobin: 14.8 g/dL (ref 13.0–17.0)
Lymphocytes Relative: 26.6 % (ref 12.0–46.0)
Lymphs Abs: 1.5 10*3/uL (ref 0.7–4.0)
MCHC: 33.1 g/dL (ref 30.0–36.0)
MCV: 92.8 fl (ref 78.0–100.0)
Monocytes Absolute: 0.6 10*3/uL (ref 0.1–1.0)
Monocytes Relative: 10.3 % (ref 3.0–12.0)
Neutro Abs: 3.3 10*3/uL (ref 1.4–7.7)
Neutrophils Relative %: 59.1 % (ref 43.0–77.0)
Platelets: 208 10*3/uL (ref 150.0–400.0)
RBC: 4.81 Mil/uL (ref 4.22–5.81)
RDW: 13.1 % (ref 11.5–15.5)
WBC: 5.6 10*3/uL (ref 4.0–10.5)

## 2021-03-02 LAB — BASIC METABOLIC PANEL
BUN: 17 mg/dL (ref 6–23)
CO2: 31 mEq/L (ref 19–32)
Calcium: 9.1 mg/dL (ref 8.4–10.5)
Chloride: 101 mEq/L (ref 96–112)
Creatinine, Ser: 1.06 mg/dL (ref 0.40–1.50)
GFR: 76.03 mL/min (ref >60.00)
Glucose, Bld: 86 mg/dL (ref 70–99)
Potassium: 4.5 mEq/L (ref 3.5–5.1)
Sodium: 138 mEq/L (ref 135–145)

## 2021-03-02 LAB — HEPATIC FUNCTION PANEL
ALT: 21 U/L (ref 0–53)
AST: 17 U/L (ref 0–37)
Albumin: 4.6 g/dL (ref 3.5–5.2)
Alkaline Phosphatase: 64 U/L (ref 39–117)
Bilirubin, Direct: 0.1 mg/dL (ref 0.0–0.3)
Total Bilirubin: 0.6 mg/dL (ref 0.2–1.2)
Total Protein: 7.3 g/dL (ref 6.0–8.3)

## 2021-03-02 LAB — TSH: TSH: 0.98 u[IU]/mL (ref 0.35–5.50)

## 2021-03-02 LAB — PSA: PSA: 0.31 ng/mL (ref 0.10–4.00)

## 2021-03-02 NOTE — Assessment & Plan Note (Signed)
Physical today 03/02/21.  Check psa today. Colonoscopy 10/2020 internal hemorrhoids.  Per pt, due f/u in 10 years.

## 2021-03-02 NOTE — Progress Notes (Signed)
Patient ID: Howard Berry, male   DOB: 02-12-1961, 60 y.o.   MRN: [id number]   Subjective:    Patient ID: Howard Berry, male    DOB: 10/12/1960, 60 y.o.   MRN: [id number]  This visit occurred during the SARS-CoV-2 public health emergency.  Safety protocols were in place, including screening questions prior to the visit, additional usage of staff PPE, and extensive cleaning of exam room while observing appropriate contact time as indicated for disinfecting solutions.   Patient here for his physical exam.   Chief Complaint  Patient presents with   Annual Exam   .   HPI History of moderate persistent asthma.  Has done well on trelegy.  Had covid 11/2020.  No residual problems.  No increased cough or congestion.  No sob.  No chest pain or tightness.  Had episode of acid reflux last week.  This occurred after eating [place] and ice cream.  No problems before and no problems after this episode.  No abdominal pain.  Bowels moving.  Had colonoscopy 10/2020 - internal hemorrhoids.  States 7 years ago, noticed his right shoulder popped.  2 years later occurred again.  No further problems until 12/2020.  Since then has noticed increased discomfort in right shoulder with abduction - especially above 90 degrees.  Also reaching around - aggravates.     Past Medical History:  Diagnosis Date   GERD (gastroesophageal reflux disease)    EGD (2007) - erosive duodenitis, reflux esophagitis, multiple gastric polyps  required dilatation.   Hypercholesterolemia    Left axis deviation    noted on prior EKG   Recurrent sinusitis    Past Surgical History:  Procedure Laterality Date   CHOLECYSTECTOMY  10/02/12   emergency surgery   [facility]   Family History  Problem Relation Age of Onset   Heart disease Father    Throat cancer Father    Lung cancer Father    Thyroid disease Mother    Hematuria Mother    Heart disease Maternal Grandmother    Heart disease Paternal Grandfather         MI (died age 28)   Colon cancer Neg Hx    Prostate cancer Neg Hx    Social History   Socioeconomic History   Marital status: Married    Spouse name: Not on file   Number of children: Not on file   Years of education: Not on file   Highest education level: Not on file  Occupational History   Not on file  Tobacco Use   Smoking status: Never   Smokeless tobacco: Current    Types: Chew  Vaping Use   Vaping Use: Never used  Substance and Sexual Activity   Alcohol use: No    Alcohol/week: 0.0 standard drinks   Drug use: No   Sexual activity: Not on file  Other Topics Concern   Not on file  Social History Narrative   Not on file   Social Determinants of Health   Financial Resource Strain: Not on file  Food Insecurity: Not on file  Transportation Needs: Not on file  Physical Activity: Not on file  Stress: Not on file  Social Connections: Not on file     Review of Systems  Constitutional:  Negative for appetite change and unexpected weight change.  HENT:  Negative for congestion, sinus pressure and sore throat.   Eyes:  Negative for pain and visual disturbance.  Respiratory:  Negative for cough, chest tightness and shortness of breath.   Cardiovascular:  Negative for chest pain, palpitations and leg swelling.  Gastrointestinal:  Negative for abdominal pain, diarrhea, nausea and vomiting.  Genitourinary:  Negative for difficulty urinating and dysuria.  Musculoskeletal:  Negative for joint swelling and myalgias.       Right shoulder pain as outlined.   Skin:  Negative for color change and rash.  Neurological:  Negative for dizziness, light-headedness and headaches.  Hematological:  Negative for adenopathy. Does not bruise/bleed easily.  Psychiatric/Behavioral:  Negative for agitation and dysphoric mood.       Objective:     BP 112/64   Pulse 82   Temp 98 F (36.7 C)   Resp 16   Ht 6' (1.829 m)   Wt 175 lb 6.4 oz (79.6 kg)   SpO2 98%   BMI 23.79 kg/m   Wt Readings from Last 3 Encounters:  03/02/21 175 lb 6.4 oz (79.6 kg)  11/04/20 172 lb (78 kg)  08/18/20 171 lb (77.6 kg)    Physical Exam Constitutional:      General: He is not in acute distress.    Appearance: Normal appearance. He is well-developed.  HENT:     Head: Normocephalic and atraumatic.     Right Ear: External ear normal.     Left Ear: External ear normal.  Eyes:     General: No scleral icterus.       Right eye: No discharge.        Left eye: No discharge.     Conjunctiva/sclera: Conjunctivae normal.  Neck:     Thyroid: No thyromegaly.  Cardiovascular:     Rate and Rhythm: Normal rate and regular rhythm.  Pulmonary:     Effort: No respiratory distress.     Breath sounds: Normal breath sounds. No wheezing.  Abdominal:     General: Bowel sounds are normal.     Palpations: Abdomen is soft.     Tenderness: There is no abdominal tenderness.  Musculoskeletal:        General: No swelling or tenderness.     Cervical back: Neck supple. No tenderness.     Comments: Increased pain - right shoulder with abduction above 90 degrees.  Increased pain - reaching around.    Lymphadenopathy:     Cervical: No cervical adenopathy.  Skin:    Findings: No erythema or rash.  Neurological:     Mental Status: He is alert and oriented to person, place, and time.  Psychiatric:        Mood and Affect: Mood normal.        Behavior: Behavior normal.     Outpatient Encounter Medications as of 03/02/2021  Medication Sig   omeprazole (PRILOSEC) 40 MG capsule TAKE 1 CAPSULE DAILY   TRELEGY ELLIPTA 100-62.5-25 MCG/INH AEPB USE 1 INHALATION DAILY (NEED APPOINTMENT FOR FURTHER REFILLS)   [DISCONTINUED] clobetasol ointment (TEMOVATE) 0.05 % Apply to affected itchy areas for 2 weeks. Can use at other itchy areas. Avoid applying to face, groin, and axilla. Use as directed   No facility-administered encounter medications on file as of 03/02/2021.     Lab Results  Component Value Date    WBC 5.6 03/02/2021   HGB 14.8 03/02/2021   HCT 44.7 03/02/2021   PLT 208.0 03/02/2021   GLUCOSE 86 03/02/2021   CHOL 190 03/02/2021   TRIG 96.0 03/02/2021   HDL 45.90 03/02/2021   LDLDIRECT 89.0 07/11/2016   LDLCALC 125 (H) 03/02/2021  ALT 21 03/02/2021   AST 17 03/02/2021   NA 138 03/02/2021   K 4.5 03/02/2021   CL 101 03/02/2021   CREATININE 1.06 03/02/2021   BUN 17 03/02/2021   CO2 31 03/02/2021   TSH 0.98 03/02/2021   PSA 0.31 03/02/2021       Assessment & Plan:   Problem List Items Addressed This Visit     Asthma    Has known mild asthma as outlined.  Has done well on Trelegy.  Breathing stable.  Follow-up.      GERD (gastroesophageal reflux disease)    Reports no acid reflux symptoms.  On Prilosec.      Health care maintenance    Physical today 03/02/21.  Check psa today. Colonoscopy 10/2020 internal hemorrhoids.  Per pt, due f/u in 10 years.        Hematuria    Recheck urine to confirm clear.       Relevant Orders   Urinalysis, Routine w reflex microscopic (Completed)   Hypercholesterolemia    Low cholesterol diet and exercise.  Follow lipid panel.       Relevant Orders   CBC with Differential/Platelet (Completed)   Basic metabolic panel (Completed)   Hepatic function panel (Completed)   Lipid panel (Completed)   TSH (Completed)   Shoulder pain, right    Persistent pain as outlined.  Refer to ortho for further evaluation.        Relevant Orders   AMB referral to orthopedics   Ambulatory referral to Orthopedic Surgery   Other Visit Diagnoses     Routine general medical examination at a health care facility    -  Primary   Prostate cancer screening       Relevant Orders   PSA (Completed)   Need for immunization against influenza       Relevant Orders   Flu Vaccine QUAD 53mo+IM (Fluarix, Fluzone & Alfiuria Quad PF) (Completed)        Einar Pheasant, MD

## 2021-03-04 ENCOUNTER — Other Ambulatory Visit: Payer: Self-pay | Admitting: Internal Medicine

## 2021-03-04 DIAGNOSIS — R319 Hematuria, unspecified: Secondary | ICD-10-CM

## 2021-03-04 NOTE — Progress Notes (Signed)
Order placed for urology referral.  

## 2021-03-08 ENCOUNTER — Encounter: Payer: Self-pay | Admitting: *Deleted

## 2021-03-12 ENCOUNTER — Encounter: Payer: Self-pay | Admitting: Internal Medicine

## 2021-03-12 DIAGNOSIS — M25511 Pain in right shoulder: Secondary | ICD-10-CM | POA: Insufficient documentation

## 2021-03-12 NOTE — Assessment & Plan Note (Signed)
Reports no acid reflux symptoms.  On Prilosec.

## 2021-03-12 NOTE — Assessment & Plan Note (Signed)
Persistent pain as outlined.  Refer to ortho for further evaluation.

## 2021-03-12 NOTE — Assessment & Plan Note (Signed)
Recheck urine to confirm clear.  

## 2021-03-12 NOTE — Assessment & Plan Note (Signed)
Has known mild asthma as outlined.  Has done well on Trelegy.  Breathing stable.  Follow-up.

## 2021-03-12 NOTE — Assessment & Plan Note (Signed)
Low cholesterol diet and exercise.  Follow lipid panel.   

## 2021-03-22 ENCOUNTER — Other Ambulatory Visit: Payer: Self-pay

## 2021-03-22 ENCOUNTER — Ambulatory Visit (INDEPENDENT_AMBULATORY_CARE_PROVIDER_SITE_OTHER): Payer: BC Managed Care – PPO | Admitting: Dermatology

## 2021-03-22 DIAGNOSIS — D239 Other benign neoplasm of skin, unspecified: Secondary | ICD-10-CM

## 2021-03-22 DIAGNOSIS — L821 Other seborrheic keratosis: Secondary | ICD-10-CM | POA: Diagnosis not present

## 2021-03-22 DIAGNOSIS — D489 Neoplasm of uncertain behavior, unspecified: Secondary | ICD-10-CM

## 2021-03-22 DIAGNOSIS — C44519 Basal cell carcinoma of skin of other part of trunk: Secondary | ICD-10-CM

## 2021-03-22 DIAGNOSIS — D225 Melanocytic nevi of trunk: Secondary | ICD-10-CM

## 2021-03-22 DIAGNOSIS — C4491 Basal cell carcinoma of skin, unspecified: Secondary | ICD-10-CM

## 2021-03-22 HISTORY — DX: Basal cell carcinoma of skin, unspecified: C44.91

## 2021-03-22 HISTORY — DX: Other benign neoplasm of skin, unspecified: D23.9

## 2021-03-22 NOTE — Patient Instructions (Addendum)
Biopsy Wound Care Instructions  Leave the original bandage on for 24 hours if possible.  If the bandage becomes soaked or soiled before that time, it is OK to remove it and examine the wound.  A small amount of post-operative bleeding is normal.  If excessive bleeding occurs, remove the bandage, place gauze over the site and apply continuous pressure (no peeking) over the area for 30 minutes. If this does not work, please call our clinic as soon as possible or page your doctor if it is after hours.   Once a day, cleanse the wound with soap and water. It is fine to shower. If a thick crust develops you may use a Q-tip dipped into dilute hydrogen peroxide (mix 1:1 with water) to dissolve it.  Hydrogen peroxide can slow the healing process, so use it only as needed.    After washing, apply petroleum jelly (Vaseline) or an antibiotic ointment if your doctor prescribed one for you, followed by a bandage.    For best healing, the wound should be covered with a layer of ointment at all times. If you are not able to keep the area covered with a bandage to hold the ointment in place, this may mean re-applying the ointment several times a day.  Continue this wound care until the wound has healed and is no longer open.   Itching and mild discomfort is normal during the healing process. However, if you develop pain or severe itching, please call our office.   If you have any discomfort, you can take Tylenol (acetaminophen) or ibuprofen as directed on the bottle. (Please do not take these if you have an allergy to them or cannot take them for another reason).  Some redness, tenderness and white or yellow material in the wound is normal healing.  If the area becomes very sore and red, or develops a thick yellow-green material (pus), it may be infected; please notify us.    If you have stitches, return to clinic as directed to have the stitches removed. You will continue wound care for 2-3 days after the stitches  are removed.   Wound healing continues for up to one year following surgery. It is not unusual to experience pain in the scar from time to time during the interval.  If the pain becomes severe or the scar thickens, you should notify the office.    A slight amount of redness in a scar is expected for the first six months.  After six months, the redness will fade and the scar will soften and fade.  The color difference becomes less noticeable with time.  If there are any problems, return for a post-op surgery check at your earliest convenience.  To improve the appearance of the scar, you can use silicone scar gel, cream, or sheets (such as Mederma or Serica) every night for up to one year. These are available over the counter (without a prescription).  Please call our office at [phone] for any questions or concerns.   Electrodesiccation and Curettage ("Scrape and Burn") Wound Care Instructions  Leave the original bandage on for 24 hours if possible.  If the bandage becomes soaked or soiled before that time, it is OK to remove it and examine the wound.  A small amount of post-operative bleeding is normal.  If excessive bleeding occurs, remove the bandage, place gauze over the site and apply continuous pressure (no peeking) over the area for 30 minutes. If this does not work, please call our  clinic as soon as possible or page your doctor if it is after hours.   Once a day, cleanse the wound with soap and water. It is fine to shower. If a thick crust develops you may use a Q-tip dipped into dilute hydrogen peroxide (mix 1:1 with water) to dissolve it.  Hydrogen peroxide can slow the healing process, so use it only as needed.    After washing, apply petroleum jelly (Vaseline) or an antibiotic ointment if your doctor prescribed one for you, followed by a bandage.    For best healing, the wound should be covered with a layer of ointment at all times. If you are not able to keep the area covered with  a bandage to hold the ointment in place, this may mean re-applying the ointment several times a day.  Continue this wound care until the wound has healed and is no longer open. It may take several weeks for the wound to heal and close.  Itching and mild discomfort is normal during the healing process.  If you have any discomfort, you can take Tylenol (acetaminophen) or ibuprofen as directed on the bottle. (Please do not take these if you have an allergy to them or cannot take them for another reason).  Some redness, tenderness and white or yellow material in the wound is normal healing.  If the area becomes very sore and red, or develops a thick yellow-green material (pus), it may be infected; please notify us.    Wound healing continues for up to one year following surgery. It is not unusual to experience pain in the scar from time to time during the interval.  If the pain becomes severe or the scar thickens, you should notify the office.    A slight amount of redness in a scar is expected for the first six months.  After six months, the redness will fade and the scar will soften and fade.  The color difference becomes less noticeable with time.  If there are any problems, return for a post-op surgery check at your earliest convenience.  To improve the appearance of the scar, you can use silicone scar gel, cream, or sheets (such as Mederma or Serica) every night for up to one year. These are available over the counter (without a prescription).  Please call our office at [phone] for any questions or concerns.      If You Need Anything After Your Visit  If you have any questions or concerns for your doctor, please call our main line at [phone] and press option 4 to reach your doctor's medical assistant. If no one answers, please leave a voicemail as directed and we will return your call as soon as possible. Messages left after 4 pm will be answered the following business day.   You  may also send [place] a message via [place]. We typically respond to MyChart messages within 1-2 business days.  For prescription refills, please ask your pharmacy to contact our office. Our fax number is [phone].  If you have an urgent issue when the clinic is closed that cannot wait until the next business day, you can page your doctor at the number below.    Please note that while we do our best to be available for urgent issues outside of office hours, we are not available 24/7.   If you have an urgent issue and are unable to reach [place], you may choose to seek medical care at your doctor's office, retail clinic, urgent  care center, or emergency room.  If you have a medical emergency, please immediately call 911 or go to the emergency department.  Pager Numbers  - Dr. Nehemiah Massed: [phone]  - Dr. Laurence Ferrari: [phone]  - Dr. Nicole Kindred: [phone]  In the event of inclement weather, please call our main line at [phone] for an update on the status of any delays or closures.  Dermatology Medication Tips: Please keep the boxes that topical medications come in in order to help keep track of the instructions about where and how to use these. Pharmacies typically print the medication instructions only on the boxes and not directly on the medication tubes.   If your medication is too expensive, please contact our office at [phone] option 4 or send [place] a message through [place].   We are unable to tell what your co-pay for medications will be in advance as this is different depending on your insurance coverage. However, we may be able to find a substitute medication at lower cost or fill out paperwork to get insurance to cover a needed medication.   If a prior authorization is required to get your medication covered by your insurance company, please allow [place] 1-2 business days to complete this process.  Drug prices often vary depending on where the prescription is filled and some  pharmacies may offer cheaper prices.  The website www.goodrx.com contains coupons for medications through different pharmacies. The prices here do not account for what the cost may be with help from insurance (it may be cheaper with your insurance), but the website can give you the price if you did not use any insurance.  - You can print the associated coupon and take it with your prescription to the pharmacy.  - You may also stop by our office during regular business hours and pick up a GoodRx coupon card.  - If you need your prescription sent electronically to a different pharmacy, notify our office through [facility] or by phone at [phone] option 4.     Si Usted Necesita Algo Despus de Su Visita  Tambin puede enviarnos un mensaje a travs de Pharmacist, community. Por lo general respondemos a los mensajes de MyChart en el transcurso de 1 a 2 das hbiles.  Para renovar recetas, por favor pida a su farmacia que se ponga en contacto con nuestra oficina. Harland Dingwall de fax es [place] [phone].  Si tiene un asunto urgente cuando la clnica est cerrada y que no puede esperar hasta el siguiente da hbil, puede llamar/localizar a su doctor(a) al nmero que aparece a continuacin.   Por favor, tenga en cuenta que aunque hacemos todo lo posible para estar disponibles para asuntos urgentes fuera del horario de [place], no estamos disponibles las 24 horas del da, los 7 das de la [place].   Si tiene un problema urgente y no puede comunicarse con nosotros, puede optar por buscar atencin mdica  en el consultorio de su doctor(a), en una clnica privada, en un centro de atencin urgente o en una sala de emergencias.  Si tiene Engineering geologist, por favor llame inmediatamente al 911 o vaya a la sala de emergencias.  Nmeros de bper  - Dr. Nehemiah Massed: [phone]  - Dra. Moye: [phone]  - Dra. Nicole Kindred: [phone]  En caso de inclemencias del [place], por favor llame a Johnsie Kindred  principal al [phone] para una actualizacin sobre el [place] de cualquier retraso o cierre.  Consejos para la medicacin en dermatologa: Por favor, guarde las cajas en  las que vienen los medicamentos de uso tpico para ayudarle a seguir las instrucciones sobre dnde y cmo usarlos. Las farmacias generalmente imprimen las instrucciones del medicamento slo en las cajas y no directamente en los tubos del [place].   Si su medicamento es muy caro, por favor, pngase en contacto con Zigmund Daniel llamando al [phone] y presione la opcin 4 o envenos un mensaje a travs de Pharmacist, community.   No podemos decirle cul ser su copago por los medicamentos por adelantado ya que esto es diferente dependiendo de la cobertura de su seguro. Sin embargo, es posible que podamos encontrar un medicamento sustituto a Electrical engineer un formulario para que el seguro cubra el medicamento que se considera necesario.   Si se requiere una autorizacin previa para que su compaa de seguros [place] su medicamento, por favor permtanos de 1 a 2 das hbiles para completar este proceso.  Los precios de los medicamentos varan con frecuencia dependiendo del Environmental consultant de dnde se surte la receta y alguna farmacias pueden ofrecer precios ms baratos.  El sitio web www.goodrx.com tiene cupones para medicamentos de Airline pilot. Los precios aqu no tienen en cuenta lo que podra costar con la ayuda del seguro (puede ser ms barato con su seguro), pero el sitio web puede darle el precio si no utiliz Research scientist (physical sciences).  - Puede imprimir el cupn correspondiente y llevarlo con su receta a la farmacia.  - Tambin puede pasar por nuestra oficina durante el horario de atencin regular y Charity fundraiser una tarjeta de cupones de GoodRx.  - Si necesita que su receta se enve electrnicamente a una farmacia diferente, informe a nuestra oficina a travs de MyChart de [place] o por telfono llamando al [phone] y presione la opcin  4.

## 2021-03-22 NOTE — Progress Notes (Signed)
Follow-Up Visit   Subjective  Howard Berry is a 60 y.o. male who presents for the following: Follow-up (Patient here today for 3 month follow up. Patient reports no new concerns. ).  The following portions of the chart were reviewed this encounter and updated as appropriate:  Tobacco  Allergies  Meds  Problems  Med Hx  Surg Hx  Fam Hx      Review of Systems: No other skin or systemic complaints except as noted in HPI or Assessment and Plan.   Objective  Well appearing patient in no apparent distress; mood and affect are within normal limits.  A focused examination was performed including right abdomen , right lower back, left posterior thigh. Relevant physical exam findings are noted in the Assessment and Plan.  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 Anesthesia: the lesion was anesthetized in a standard fashion   Anesthetic:  1% lidocaine w/ epinephrine 1-100,000 buffered w/ 8.4% NaHCO3 Instrument used: flexible razor blade   Hemostasis achieved with: aluminum chloride   Outcome: patient tolerated procedure well   Post-procedure details: wound care instructions given   Additional details:  Mupirocin and a bandage applied  Destruction of lesion  Destruction method: electrodesiccation and curettage   Informed consent: discussed and consent obtained   Timeout:  patient name, date of birth, surgical site, and procedure verified Patient was prepped and draped in usual sterile fashion: area prepped with isopropyl alcohol. Anesthesia: the lesion  was anesthetized in a standard fashion   Anesthetic:  1% lidocaine w/ epinephrine 1-100,000 buffered w/ 8.4% NaHCO3 Curettage performed in three different directions: Yes   Electrodesiccation performed over the curetted area: Yes   Curettage cycles:  3 Final wound size (cm):  0.7 Hemostasis achieved with:  electrodesiccation Outcome: patient tolerated procedure well with no complications   Post-procedure details: wound care instructions given   Additional details:  Mupirocin and a pressure dressing applied  Specimen 1 - Surgical pathology Differential Diagnosis: r/o bcc   Check Margins: No  Right Lower Back  Epidermal / dermal shaving  Lesion diameter (cm):  0.4 Informed consent: discussed and consent obtained   Timeout: patient name, date of birth, surgical site, and procedure verified   Patient was prepped and draped in usual sterile fashion: area prepped with isopropyl alcohol. Anesthesia: the lesion was anesthetized in a standard fashion   Anesthetic:  1% lidocaine w/ epinephrine 1-100,000 buffered w/ 8.4% NaHCO3 Instrument used: flexible razor blade   Hemostasis achieved with: aluminum chloride   Outcome: patient tolerated procedure well   Post-procedure details: wound care instructions given   Additional details:  Mupirocin and a bandage applied  Specimen 2 - Surgical pathology Differential Diagnosis: r/o atypia   Check Margins: No  R/o bcc right abdomen. Discussed options of treatment including ED&C (round scar, approx 85% cure rate) vs excision (line scar, approx 92% cure rate) if BCC. Pt prefers ED&C so will proceed with ED&C after biopsy today.   R/o atypia at back  Seborrheic Keratoses - Stuck-on, waxy, tan-brown papules and/or plaques  - Benign-appearing -  Discussed benign etiology and prognosis. - Observe - Call for any changes  Return for 3 month follow up . I, Ruthell Rummage, CMA, am acting as scribe for Forest Gleason, MD.  Documentation: I have  reviewed the above documentation for accuracy and completeness, and I agree with the above.  Forest Gleason, MD

## 2021-03-27 DIAGNOSIS — G8929 Other chronic pain: Secondary | ICD-10-CM | POA: Diagnosis not present

## 2021-03-27 DIAGNOSIS — M7541 Impingement syndrome of right shoulder: Secondary | ICD-10-CM | POA: Diagnosis not present

## 2021-03-27 DIAGNOSIS — M25511 Pain in right shoulder: Secondary | ICD-10-CM | POA: Diagnosis not present

## 2021-03-27 DIAGNOSIS — M7551 Bursitis of right shoulder: Secondary | ICD-10-CM | POA: Diagnosis not present

## 2021-03-29 ENCOUNTER — Telehealth: Payer: Self-pay

## 2021-03-29 NOTE — Telephone Encounter (Signed)
Patient informed of pathology results 

## 2021-03-29 NOTE — Telephone Encounter (Signed)
-----   Message from Alfonso Patten, MD sent at 03/29/2021 12:09 PM EST ----- 1. Skin , right abdomen BASAL CELL CARCINOMA, NODULAR PATTERN, CLOSE TO MARGIN-->already treated with ED&C, will recheck at follow-up  2. 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 On the spectrum from normal mole to melanoma skin cancer, this is in between the two. - We need to recheck this area sometime in the next 6 months to be sure there is no evidence of the atypical mole coming back. If there is any color coming back, we would recommend repeating the biopsy to be sure the cells look normal.  - People who have a history of atypical moles do have a slightly increased risk of developing melanoma somewhere on the body, so a yearly full body skin exam by a dermatologist is recommended.  - Monthly self skin checks and daily sun protection are also recommended.  - Please call if you notice a dark spot coming back where this biopsy was taken.  - Please also call if you notice any new or changing spots anywhere else on the body before your follow-up visit.   MAs please call. Thank you!

## 2021-04-04 ENCOUNTER — Encounter: Payer: Self-pay | Admitting: Dermatology

## 2021-05-25 ENCOUNTER — Other Ambulatory Visit: Payer: Self-pay | Admitting: Pulmonary Disease

## 2021-06-22 ENCOUNTER — Encounter: Payer: Self-pay | Admitting: Dermatology

## 2021-06-22 ENCOUNTER — Other Ambulatory Visit: Payer: Self-pay

## 2021-06-22 ENCOUNTER — Ambulatory Visit (INDEPENDENT_AMBULATORY_CARE_PROVIDER_SITE_OTHER): Payer: BC Managed Care – PPO | Admitting: Dermatology

## 2021-06-22 DIAGNOSIS — Z86018 Personal history of other benign neoplasm: Secondary | ICD-10-CM

## 2021-06-22 DIAGNOSIS — Z85828 Personal history of other malignant neoplasm of skin: Secondary | ICD-10-CM

## 2021-06-22 DIAGNOSIS — L720 Epidermal cyst: Secondary | ICD-10-CM

## 2021-06-22 DIAGNOSIS — L578 Other skin changes due to chronic exposure to nonionizing radiation: Secondary | ICD-10-CM | POA: Diagnosis not present

## 2021-06-22 NOTE — Patient Instructions (Signed)
Recommend taking Heliocare sun protection supplement daily in sunny weather for additional sun protection. For maximum protection on the sunniest days, you can take up to 2 capsules of regular Heliocare OR take 1 capsule of Heliocare Ultra. For prolonged exposure (such as a full day in the sun), you can repeat your dose of the supplement 4 hours after your first dose. Heliocare can be purchased at [employer], at some [facility] or at [email or web address].    Recommend daily broad spectrum sunscreen SPF 30+ to sun-exposed areas, reapply every 2 hours as needed. Call for new or changing lesions.  Staying in the shade or wearing long sleeves, sun glasses (UVA+UVB protection) and wide brim hats (4-inch brim around the entire circumference of the hat) are also recommended for sun protection.   If You Need Anything After Your Visit  If you have any questions or concerns for your doctor, please call our main line at [phone] and press option 4 to reach your doctor's medical assistant. If no one answers, please leave a voicemail as directed and we will return your call as soon as possible. Messages left after 4 pm will be answered the following business day.   You may also send [place] a message via [place]. We typically respond to MyChart messages within 1-2 business days.  For prescription refills, please ask your pharmacy to contact our office. Our fax number is [phone].  If you have an urgent issue when the clinic is closed that cannot wait until the next business day, you can page your doctor at the number below.    Please note that while we do our best to be available for urgent issues outside of office hours, we are not available 24/7.   If you have an urgent issue and are unable to reach [place], you may choose to seek medical care at your doctor's office, retail clinic, urgent care center, or emergency room.  If you have a medical emergency, please immediately call 911 or go to the emergency  department.  Pager Numbers  - Dr. Nehemiah Massed: [phone]  - Dr. Laurence Ferrari: [phone]  - Dr. Nicole Kindred: [phone]  In the event of inclement weather, please call our main line at [phone] for an update on the status of any delays or closures.  Dermatology Medication Tips: Please keep the boxes that topical medications come in in order to help keep track of the instructions about where and how to use these. Pharmacies typically print the medication instructions only on the boxes and not directly on the medication tubes.   If your medication is too expensive, please contact our office at [phone] option 4 or send [place] a message through [place].   We are unable to tell what your co-pay for medications will be in advance as this is different depending on your insurance coverage. However, we may be able to find a substitute medication at lower cost or fill out paperwork to get insurance to cover a needed medication.   If a prior authorization is required to get your medication covered by your insurance company, please allow [place] 1-2 business days to complete this process.  Drug prices often vary depending on where the prescription is filled and some pharmacies may offer cheaper prices.  The website www.goodrx.com contains coupons for medications through different pharmacies. The prices here do not account for what the cost may be with help from insurance (it may be cheaper with your insurance), but the website can give you the price if you did  not use any insurance.  - You can print the associated coupon and take it with your prescription to the pharmacy.  - You may also stop by our office during regular business hours and pick up a GoodRx coupon card.  - If you need your prescription sent electronically to a different pharmacy, notify our office through [facility] or by phone at [phone] option 4.     Si Usted Necesita Algo Despus de Su Visita  Tambin puede enviarnos un  mensaje a travs de Pharmacist, community. Por lo general respondemos a los mensajes de MyChart en el transcurso de 1 a 2 das hbiles.  Para renovar recetas, por favor pida a su farmacia que se ponga en contacto con nuestra oficina. Harland Dingwall de fax es [place] [phone].  Si tiene un asunto urgente cuando la clnica est cerrada y que no puede esperar hasta el siguiente da hbil, puede llamar/localizar a su doctor(a) al nmero que aparece a continuacin.   Por favor, tenga en cuenta que aunque hacemos todo lo posible para estar disponibles para asuntos urgentes fuera del horario de [place], no estamos disponibles las 24 horas del da, los 7 das de la [place].   Si tiene un problema urgente y no puede comunicarse con nosotros, puede optar por buscar atencin mdica  en el consultorio de su doctor(a), en una clnica privada, en un centro de atencin urgente o en una sala de emergencias.  Si tiene Engineering geologist, por favor llame inmediatamente al 911 o vaya a la sala de emergencias.  Nmeros de bper  - Dr. Nehemiah Massed: [phone]  - Dra. Moye: [phone]  - Dra. Nicole Kindred: [phone]  En caso de inclemencias del [place], por favor llame a Johnsie Kindred principal al [phone] para una actualizacin sobre el [place] de cualquier retraso o cierre.  Consejos para la medicacin en dermatologa: Por favor, guarde las cajas en las que vienen los medicamentos de uso tpico para ayudarle a seguir las instrucciones sobre dnde y cmo usarlos. Las farmacias generalmente imprimen las instrucciones del medicamento slo en las cajas y no directamente en los tubos del [place].   Si su medicamento es muy caro, por favor, pngase en contacto con Zigmund Daniel llamando al [phone] y presione la opcin 4 o envenos un mensaje a travs de Pharmacist, community.   No podemos decirle cul ser su copago por los medicamentos por adelantado ya que esto es diferente dependiendo de la cobertura de su seguro. Sin embargo,  es posible que podamos encontrar un medicamento sustituto a Electrical engineer un formulario para que el seguro cubra el medicamento que se considera necesario.   Si se requiere una autorizacin previa para que su compaa de seguros [place] su medicamento, por favor permtanos de 1 a 2 das hbiles para completar este proceso.  Los precios de los medicamentos varan con frecuencia dependiendo del Environmental consultant de dnde se surte la receta y alguna farmacias pueden ofrecer precios ms baratos.  El sitio web www.goodrx.com tiene cupones para medicamentos de Airline pilot. Los precios aqu no tienen en cuenta lo que podra costar con la ayuda del seguro (puede ser ms barato con su seguro), pero el sitio web puede darle el precio si no utiliz Research scientist (physical sciences).  - Puede imprimir el cupn correspondiente y llevarlo con su receta a la farmacia.  - Tambin puede pasar por nuestra oficina durante el horario de atencin regular y Charity fundraiser una tarjeta de cupones de GoodRx.  - Si necesita que su receta se enve  electrnicamente a [place] diferente, informe a nuestra oficina a travs de MyChart de [place] o por telfono llamando al [phone] y presione la opcin 4.

## 2021-06-22 NOTE — Progress Notes (Signed)
° °  Follow-Up Visit   Subjective  Howard Berry is a 61 y.o. male who presents for the following: Follow-up (BCC at right abdomen. DN at right lower back. Patient reports no problems while healing).  The patient has spots, moles and lesions to be evaluated, some may be new or changing and the patient has concerns that these could be cancer.   The following portions of the chart were reviewed this encounter and updated as appropriate:  Tobacco   Allergies   Meds   Problems   Med Hx   Surg Hx   Fam Hx       Review of Systems: No other skin or systemic complaints except as noted in HPI or Assessment and Plan.   Objective  Well appearing patient in no apparent distress; mood and affect are within normal limits.  A focused examination was performed including back and abdomen. Relevant physical exam findings are noted in the Assessment and Plan.  Left Upper Back 0.7 cm subcutaneous nodule.    Assessment & Plan   History of Basal Cell Carcinoma of the Skin - No evidence of recurrence today at right abdomen - Recommend regular full body skin exams - Recommend daily broad spectrum sunscreen SPF 30+ to sun-exposed areas, reapply every 2 hours as needed.  - Call if any new or changing lesions are noted between office visits   Epidermal inclusion cyst Left Upper Back  Benign-appearing. Exam most consistent with an epidermal inclusion cyst. Discussed that a cyst is a benign growth that can grow over time and sometimes get irritated or inflamed. Recommend observation if it is not bothersome. Discussed option of surgical excision to remove it if it is growing, symptomatic, or other changes noted. Please call for new or changing lesions so they can be evaluated.    History of Dysplastic Nevus - No evidence of recurrence today at right lower back - Recommend regular full body skin exams - Recommend daily broad spectrum sunscreen SPF 30+ to sun-exposed areas, reapply every 2 hours as needed.   - Call if any new or changing lesions are noted between office visits   Actinic Damage - chronic, secondary to cumulative UV radiation exposure/sun exposure over time - diffuse scaly erythematous macules with underlying dyspigmentation - Recommend daily broad spectrum sunscreen SPF 30+ to sun-exposed areas, reapply every 2 hours as needed.  - Recommend staying in the shade or wearing long sleeves, sun glasses (UVA+UVB protection) and wide brim hats (4-inch brim around the entire circumference of the hat). - Call for new or changing lesions. - Recommend taking Heliocare sun protection supplement daily in sunny weather for additional sun protection. For maximum protection on the sunniest days, you can take up to 2 capsules of regular Heliocare OR take 1 capsule of Heliocare Ultra. For prolonged exposure (such as a full day in the sun), you can repeat your dose of the supplement 4 hours after your first dose.   Return for TBSE As Scheduled.  I, Emelia Salisbury, MA, scribed for Alfonso Patten, MD.  Documentation: I have reviewed the above documentation for accuracy and completeness, and I agree with the above.  Forest Gleason, MD

## 2021-07-03 ENCOUNTER — Encounter: Payer: Self-pay | Admitting: Dermatology

## 2021-07-11 ENCOUNTER — Other Ambulatory Visit: Payer: Self-pay

## 2021-07-11 ENCOUNTER — Encounter: Payer: Self-pay | Admitting: Pulmonary Disease

## 2021-07-11 ENCOUNTER — Ambulatory Visit (INDEPENDENT_AMBULATORY_CARE_PROVIDER_SITE_OTHER): Payer: BC Managed Care – PPO | Admitting: Pulmonary Disease

## 2021-07-11 VITALS — BP 120/70 | HR 91 | Temp 98.2°F | Ht 72.0 in | Wt 174.0 lb

## 2021-07-11 DIAGNOSIS — J310 Chronic rhinitis: Secondary | ICD-10-CM

## 2021-07-11 DIAGNOSIS — J4540 Moderate persistent asthma, uncomplicated: Secondary | ICD-10-CM | POA: Diagnosis not present

## 2021-07-11 DIAGNOSIS — J329 Chronic sinusitis, unspecified: Secondary | ICD-10-CM | POA: Diagnosis not present

## 2021-07-11 MED ORDER — MONTELUKAST SODIUM 10 MG PO TABS: 10.0000 mg | ORAL_TABLET | Freq: Every day | ORAL | 3 refills | Status: DC

## 2021-07-11 MED ORDER — ALBUTEROL SULFATE HFA 108 (90 BASE) MCG/ACT IN AERS: 2.0000 | INHALATION_SPRAY | Freq: Four times a day (QID) | RESPIRATORY_TRACT | 2 refills | Status: DC | PRN

## 2021-07-11 MED ORDER — TRELEGY ELLIPTA 100-62.5-25 MCG/ACT IN AEPB: 1.0000 | INHALATION_SPRAY | Freq: Every day | RESPIRATORY_TRACT | 3 refills | Status: DC

## 2021-07-11 NOTE — Patient Instructions (Signed)
Continue your Trelegy, we have added also a tablet called Singulair to see if this helps some with your sinuses. ? ?I will send also a prescription for the albuterol rescue inhaler that is 1 inhaler that you can use if you notice any shortness of breath or cough out of the ordinary. ? ?We will see you in follow-up in 4 months time call sooner should any new problems arise. ?

## 2021-07-11 NOTE — Progress Notes (Signed)
Subjective:    Patient ID: Howard Berry, male    DOB: 1961-02-11, 61 y.o.   MRN: [id number] Patient Care Team: Einar Pheasant, MD as PCP - General (Internal Medicine)  Chief Complaint  Patient presents with   Follow-up    HPI Patient is a 61 year old lifelong never smoker (does use chewing tobacco) who presents for follow-up on the issue of moderate persistent asthma complication.  He also has gastroesophageal reflux disease well controlled on PPI.  Patient is currently maintained on Trelegy Ellipta 100/62.5/25.  Previously he had been noted to have eosinophilia.  Allergen panel showed sensitivities to dust mites and dust as well as American cockroach. Alpha-1 antitrypsin is normal.  PFTs on 19 November 2019 where essentially normal.  No postbronchodilator measurement done.  This data is consistent with asthma.   He notes that he is doing well on Trelegy.  Cough is well controlled.  Gastroesophageal reflux also well controlled.  He has not noticed any sputum production or hemoptysis.  When he has cough is usually dry.  He has not had shortness of breath and has been able to perform his activities of daily living without difficulties.  Able to work without issues.  Does have chronic nasal congestion that flares seasonally.  Is beginning to notice "spring congestion".   Review of Systems A 10 point review of systems was performed and it is as noted above otherwise negative.  Patient Active Problem List   Diagnosis Date Noted   Shoulder pain, right 03/12/2021   COVID-19 virus infection 11/05/2020   Asthma 11/05/2020   Sinus congestion 08/23/2019   Cough 08/11/2017   Health care maintenance 09/22/2014   Lightheaded 07/30/2013   Headache(784.0) 07/30/2013   Atypical chest pain 07/30/2013   Hypercholesterolemia 05/13/2012   GERD (gastroesophageal reflux disease) 05/13/2012   Low testosterone 05/13/2012   Hematuria 05/13/2012   Social History   Tobacco Use   Smoking status: Never    Smokeless tobacco: Current    Types: Chew  Substance Use Topics   Alcohol use: No    Alcohol/week: 0.0 standard drinks   Allergies  Allergen Reactions   Augmentin [Amoxicillin-Pot Clavulanate]    Chlor-Trimeton [Chlorpheniramine Maleate]    Penicillins    Current Meds  Medication Sig   omeprazole (PRILOSEC) 40 MG capsule TAKE 1 CAPSULE DAILY   TRELEGY ELLIPTA 100-62.5-25 MCG/INH AEPB USE 1 INHALATION DAILY (NEED APPOINTMENT FOR FURTHER REFILLS)   Immunization History  Administered Date(s) Administered   Influenza,inj,Quad PF,6+ Mos 02/05/2017, 02/11/2018, 02/16/2019, 02/17/2020, 03/02/2021   PFIZER(Purple Top)SARS-COV-2 Vaccination 08/05/2019, 08/26/2019, 04/20/2020       Objective:   Physical Exam BP 120/70 (BP Location: Left Arm, Patient Position: Sitting, Cuff Size: Normal)   Pulse 91   Temp 98.2 F (36.8 C) (Oral)   Ht 6' (1.829 m)   Wt 174 lb (78.9 kg)   SpO2 96%   BMI 23.60 kg/m  GENERAL: Well-developed well-nourished gentleman in no acute distress.  Fully ambulatory. HEAD: Normocephalic, atraumatic.  EYES: Pupils equal, round, reactive to light.  No scleral icterus.  MOUTH: Nose/mouth/throat not examined due to masking requirements for COVID 19. NECK: Supple. No thyromegaly. Trachea midline. No JVD.  No adenopathy. PULMONARY: Good air entry bilaterally.  Faint end expiratory wheeze left upper lung zone otherwise clear.  CARDIOVASCULAR: S1 and S2. Regular rate and rhythm.  No rubs, murmurs or gallops heard. ABDOMEN: Benign. MUSCULOSKELETAL: No joint deformity, no clubbing, no edema.  NEUROLOGIC: No focal deficits, speech is fluent.  No  gait disturbance.   SKIN: Intact,warm,dry.  No rashes. PSYCH: Mood and behavior normal.      Assessment & Plan:     ICD-10-CM   1. Moderate persistent asthma without complication  123456    Continue Trelegy Ellipta Continue as needed albuterol    2. Chronic rhinosinusitis  J31.0    J32.9    Trial of Singulair     Meds  ordered this encounter  Medications   Fluticasone-Umeclidin-Vilant (TRELEGY ELLIPTA) 100-62.5-25 MCG/ACT AEPB    Sig: Inhale 1 puff into the lungs daily.    Dispense:  3 each    Refill:  3   montelukast (SINGULAIR) 10 MG tablet    Sig: Take 1 tablet (10 mg total) by mouth at bedtime.    Dispense:  90 tablet    Refill:  3   albuterol (VENTOLIN HFA) 108 (90 Base) MCG/ACT inhaler    Sig: Inhale 2 puffs into the lungs every 6 (six) hours as needed for wheezing or shortness of breath.    Dispense:  8 g    Refill:  2   Overall Nicole Kindred appears to be very well compensated.  Will give him a trial of Singulair to see if this helps with his sinus issues.  Continue albuterol and Trelegy.  See him in follow-up in 4 months time he is to contact us prior to that time should any new difficulties arise.  Renold Don, MD Advanced Bronchoscopy PCCM [facility]    *This note was dictated using voice recognition software/Dragon.  Despite best efforts to proofread, errors can occur which can change the meaning. Any transcriptional errors that result from this process are unintentional and may not be fully corrected at the time of dictation.

## 2021-08-03 ENCOUNTER — Encounter: Payer: Self-pay | Admitting: Internal Medicine

## 2021-08-31 ENCOUNTER — Ambulatory Visit: Payer: BC Managed Care – PPO | Admitting: Internal Medicine

## 2021-12-07 ENCOUNTER — Ambulatory Visit: Payer: 59 | Admitting: Dermatology

## 2021-12-07 DIAGNOSIS — C4491 Basal cell carcinoma of skin, unspecified: Secondary | ICD-10-CM

## 2021-12-07 DIAGNOSIS — L578 Other skin changes due to chronic exposure to nonionizing radiation: Secondary | ICD-10-CM | POA: Diagnosis not present

## 2021-12-07 DIAGNOSIS — D492 Neoplasm of unspecified behavior of bone, soft tissue, and skin: Secondary | ICD-10-CM

## 2021-12-07 DIAGNOSIS — Z1283 Encounter for screening for malignant neoplasm of skin: Secondary | ICD-10-CM

## 2021-12-07 DIAGNOSIS — L570 Actinic keratosis: Secondary | ICD-10-CM

## 2021-12-07 DIAGNOSIS — L821 Other seborrheic keratosis: Secondary | ICD-10-CM

## 2021-12-07 DIAGNOSIS — C44519 Basal cell carcinoma of skin of other part of trunk: Secondary | ICD-10-CM

## 2021-12-07 DIAGNOSIS — L814 Other melanin hyperpigmentation: Secondary | ICD-10-CM

## 2021-12-07 DIAGNOSIS — D229 Melanocytic nevi, unspecified: Secondary | ICD-10-CM

## 2021-12-07 DIAGNOSIS — Z86018 Personal history of other benign neoplasm: Secondary | ICD-10-CM

## 2021-12-07 DIAGNOSIS — D1800 Hemangioma unspecified site: Secondary | ICD-10-CM

## 2021-12-07 DIAGNOSIS — Z85828 Personal history of other malignant neoplasm of skin: Secondary | ICD-10-CM

## 2021-12-07 HISTORY — DX: Basal cell carcinoma of skin, unspecified: C44.91

## 2021-12-07 NOTE — Patient Instructions (Addendum)
Cryotherapy Aftercare  Wash gently with soap and water everyday.   Apply Vaseline and Band-Aid daily until healed.    Wound Care Instructions  Cleanse wound gently with soap and water once a day then pat dry with clean gauze. Apply a thing coat of Petrolatum (petroleum jelly, "Vaseline") over the wound (unless you have an allergy to this). We recommend that you use a new, sterile tube of Vaseline. Do not pick or remove scabs. Do not remove the yellow or white "healing tissue" from the base of the wound.  Cover the wound with fresh, clean, nonstick gauze and secure with paper tape. You may use Band-Aids in place of gauze and tape if the would is small enough, but would recommend trimming much of the tape off as there is often too much. Sometimes Band-Aids can irritate the skin.  You should call the office for your biopsy report after 1 week if you have not already been contacted.  If you experience any problems, such as abnormal amounts of bleeding, swelling, significant bruising, significant pain, or evidence of infection, please call the office immediately.  FOR ADULT SURGERY PATIENTS: If you need something for pain relief you may take 1 extra strength Tylenol (acetaminophen) AND 2 Ibuprofen ('200mg'$  each) together every 4 hours as needed for pain. (do not take these if you are allergic to them or if you have a reason you should not take them.) Typically, you may only need pain medication for 1 to 3 days.    Recommend taking Heliocare sun protection supplement daily in sunny weather for additional sun protection. For maximum protection on the sunniest days, you can take up to 2 capsules of regular Heliocare OR take 1 capsule of Heliocare Ultra. For prolonged exposure (such as a full day in the sun), you can repeat your dose of the supplement 4 hours after your first dose. Heliocare can be purchased at [employer], at some [facility] or at [email or web address].    Melanoma ABCDEs  Melanoma  is the most dangerous type of skin cancer, and is the leading cause of death from skin disease.  You are more likely to develop melanoma if you: Have light-colored skin, light-colored eyes, or red or blond hair Spend a lot of time in the sun Tan regularly, either outdoors or in a tanning bed Have had blistering sunburns, especially during childhood Have a close family member who has had a melanoma Have atypical moles or large birthmarks  Early detection of melanoma is key since treatment is typically straightforward and cure rates are extremely high if we catch it early.   The first sign of melanoma is often a change in a mole or a new dark spot.  The ABCDE system is a [place] of remembering the signs of melanoma.  A for asymmetry:  The two halves do not match. B for border:  The edges of the growth are irregular. C for color:  A mixture of colors are present instead of an even brown color. D for diameter:  Melanomas are usually (but not always) greater than 1m - the size of a pencil eraser. E for evolution:  The spot keeps changing in size, shape, and color.  Please check your skin once per month between visits. You can use a small mirror in front and a large mirror behind you to keep an eye on the back side or your body.   If you see any new or changing lesions before your next follow-up, please call to  a visit.  Please continue daily skin protection including broad spectrum sunscreen SPF 30+ to sun-exposed areas, reapplying every 2 hours as needed when you're outdoors.    Due to recent changes in healthcare laws, you may see results of your pathology and/or laboratory studies on MyChart before the doctors have had a chance to review them. We understand that in some cases there may be results that are confusing or concerning to you. Please understand that not all results are received at the same time and often the doctors may need to interpret multiple results in order to provide you  with the best plan of care or course of treatment. Therefore, we ask that you please give us 2 business days to thoroughly review all your results before contacting the office for clarification. Should we see a critical lab result, you will be contacted sooner.   If You Need Anything After Your Visit  If you have any questions or concerns for your doctor, please call our main line at [phone] and press option 4 to reach your doctor's medical assistant. If no one answers, please leave a voicemail as directed and we will return your call as soon as possible. Messages left after 4 pm will be answered the following business day.   You may also send us a message via MyChart. We typically respond to MyChart messages within 1-2 business days.  For prescription refills, please ask your pharmacy to contact our office. Our fax number is [phone].  If you have an urgent issue when the clinic is closed that cannot wait until the next business day, you can page your doctor at the number below.    Please note that while we do our best to be available for urgent issues outside of office hours, we are not available 24/7.   If you have an urgent issue and are unable to reach us, you may choose to seek medical care at your doctor's office, retail clinic, urgent care center, or emergency room.  If you have a medical emergency, please immediately call 911 or go to the emergency department.  Pager Numbers  - Dr. Kowalski: [phone]  - Dr. Moye: [phone]  - Dr. Stewart: [phone]  In the event of inclement weather, please call our main line at [phone] for an update on the status of any delays or closures.  Dermatology Medication Tips: Please keep the boxes that topical medications come in in order to help keep track of the instructions about where and how to use these. Pharmacies typically print the medication instructions only on the boxes and not directly on the medication tubes.    If your medication is too expensive, please contact our office at [phone] option 4 or send us a message through MyChart.   We are unable to tell what your co-pay for medications will be in advance as this is different depending on your insurance coverage. However, we may be able to find a substitute medication at lower cost or fill out paperwork to get insurance to cover a needed medication.   If a prior authorization is required to get your medication covered by your insurance company, please allow us 1-2 business days to complete this process.  Drug prices often vary depending on where the prescription is filled and some pharmacies may offer cheaper prices.  The website www.goodrx.com contains coupons for medications through different pharmacies. The prices here do not account for what the cost may be with help from insurance (it may be cheaper   with your insurance), but the website can give you the price if you did not use any insurance.  - You can print the associated coupon and take it with your prescription to the pharmacy.  - You may also stop by our office during regular business hours and pick up a GoodRx coupon card.  - If you need your prescription sent electronically to a different pharmacy, notify our office through [facility] MyChart or by phone at [phone] option 4.     Si Usted Necesita Algo Despus de Su Visita  Tambin puede enviarnos un mensaje a travs de MyChart. Por lo general respondemos a los mensajes de MyChart en el transcurso de 1 a 2 das hbiles.  Para renovar recetas, por favor pida a su farmacia que se ponga en contacto con nuestra oficina. Nuestro nmero de fax es el [phone].  Si tiene un asunto urgente cuando la clnica est cerrada y que no puede esperar hasta el siguiente da hbil, puede llamar/localizar a su doctor(a) al nmero que aparece a continuacin.   Por favor, tenga en cuenta que aunque hacemos todo lo posible para estar  disponibles para asuntos urgentes fuera del horario de oficina, no estamos disponibles las 24 horas del da, los 7 das de la semana.   Si tiene un problema urgente y no puede comunicarse con nosotros, puede optar por buscar atencin mdica  en el consultorio de su doctor(a), en una clnica privada, en un centro de atencin urgente o en una sala de emergencias.  Si tiene una emergencia mdica, por favor llame inmediatamente al 911 o vaya a la sala de emergencias.  Nmeros de bper  - Dr. Kowalski: [phone]  - Dra. Moye: [phone]  - Dra. Stewart: [phone]  En caso de inclemencias del tiempo, por favor llame a nuestra lnea principal al [phone] para una actualizacin sobre el estado de cualquier retraso o cierre.  Consejos para la medicacin en dermatologa: Por favor, guarde las cajas en las que vienen los medicamentos de uso tpico para ayudarle a seguir las instrucciones sobre dnde y cmo usarlos. Las farmacias generalmente imprimen las instrucciones del medicamento slo en las cajas y no directamente en los tubos del medicamento.   Si su medicamento es muy caro, por favor, pngase en contacto con nuestra oficina llamando al [phone] y presione la opcin 4 o envenos un mensaje a travs de MyChart.   No podemos decirle cul ser su copago por los medicamentos por adelantado ya que esto es diferente dependiendo de la cobertura de su seguro. Sin embargo, es posible que podamos encontrar un medicamento sustituto a menor costo o llenar un formulario para que el seguro cubra el medicamento que se considera necesario.   Si se requiere una autorizacin previa para que su compaa de seguros cubra su medicamento, por favor permtanos de 1 a 2 das hbiles para completar este proceso.  Los precios de los medicamentos varan con frecuencia dependiendo del lugar de dnde se surte la receta y alguna farmacias pueden ofrecer precios ms baratos.  El sitio web www.goodrx.com tiene  cupones para medicamentos de diferentes farmacias. Los precios aqu no tienen en cuenta lo que podra costar con la ayuda del seguro (puede ser ms barato con su seguro), pero el sitio web puede darle el precio si no utiliz ningn seguro.  - Puede imprimir el cupn correspondiente y llevarlo con su receta a la farmacia.  - Tambin puede pasar por nuestra oficina durante el horario de atencin regular y recoger una   tarjeta de cupones de GoodRx.  - Si necesita que su receta se enve electrnicamente a una farmacia diferente, informe a nuestra oficina a travs de MyChart de  o por telfono llamando al [phone] y presione la opcin 4.  

## 2021-12-07 NOTE — Progress Notes (Signed)
Follow-Up Visit   Subjective  Howard Berry is a 61 y.o. male who presents for the following: Annual Exam (The patient presents for Total-Body Skin Exam (TBSE) for skin cancer screening and mole check.  The patient has spots, moles and lesions to be evaluated, some may be new or changing and the patient has concerns that these could be cancer. Patient with hx of BCC and dysplastic nevi. ).   The following portions of the chart were reviewed this encounter and updated as appropriate:   Tobacco  Allergies  Meds  Problems  Med Hx  Surg Hx  Fam Hx      Review of Systems:  No other skin or systemic complaints except as noted in HPI or Assessment and Plan.  Objective  Well appearing patient in no apparent distress; mood and affect are within normal limits.  A full examination was performed including scalp, head, eyes, ears, nose, lips, neck, chest, axillae, abdomen, back, buttocks, bilateral upper extremities, bilateral lower extremities, hands, feet, fingers, toes, fingernails, and toenails. All findings within normal limits unless otherwise noted below.  Left Wrist x 1, left forehead x 1, right frontal scalp x 1 (3) Erythematous thin papules/macules with gritty scale.   Low chest right of midline 0.9 cm thin scaly pink papule R/o BCC       Assessment & Plan  AK (actinic keratosis) (3) Left Wrist x 1, left forehead x 1, right frontal scalp x 1  Actinic keratoses are precancerous spots that appear secondary to cumulative UV radiation exposure/sun exposure over time. They are chronic with expected duration over 1 year. A portion of actinic keratoses will progress to squamous cell carcinoma of the skin. It is not possible to reliably predict which spots will progress to skin cancer and so treatment is recommended to prevent development of skin cancer.  Recommend daily broad spectrum sunscreen SPF 30+ to sun-exposed areas, reapply every 2 hours as needed.  Recommend staying in  the shade or wearing long sleeves, sun glasses (UVA+UVB protection) and wide brim hats (4-inch brim around the entire circumference of the hat). Call for new or changing lesions.  Prior to procedure, discussed risks of blister formation, small wound, skin dyspigmentation, or rare scar following cryotherapy. Recommend Vaseline ointment to treated areas while healing.   Destruction of lesion - Left Wrist x 1, left forehead x 1, right frontal scalp x 1  Destruction method: cryotherapy   Informed consent: discussed and consent obtained   Lesion destroyed using liquid nitrogen: Yes   Cryotherapy cycles:  2 Outcome: patient tolerated procedure well with no complications   Post-procedure details: wound care instructions given    Neoplasm of skin Low chest right of midline  Epidermal / dermal shaving  Lesion diameter (cm):  0.9 Informed consent: discussed and consent obtained   Timeout: patient name, date of birth, surgical site, and procedure verified   Anesthesia: the lesion was anesthetized in a standard fashion   Anesthetic:  1% lidocaine w/ epinephrine 1-100,000 local infiltration Instrument used: flexible razor blade   Hemostasis achieved with: aluminum chloride   Outcome: patient tolerated procedure well   Post-procedure details: wound care instructions given   Additional details:  Mupirocin and a bandage applied  Destruction of lesion  Destruction method: electrodesiccation and curettage   Informed consent: discussed and consent obtained   Timeout:  patient name, date of birth, surgical site, and procedure verified Anesthesia: the lesion was anesthetized in a standard fashion   Anesthetic:  1% lidocaine w/ epinephrine 1-100,000 buffered w/ 8.4% NaHCO3 Curettage performed in three different directions: Yes   Electrodesiccation performed over the curetted area: Yes   Curettage cycles:  3 Final wound size (cm):  1.5 Hemostasis achieved with:  electrodesiccation Outcome:  patient tolerated procedure well with no complications   Post-procedure details: sterile dressing applied and wound care instructions given   Dressing type: petrolatum    Specimen 1 - Surgical pathology Differential Diagnosis: R/o BCC  Check Margins: No 0.9 cm thin scaly pink papule    Lentigines - Scattered tan macules - Due to sun exposure - Benign-appearing, observe - Recommend daily broad spectrum sunscreen SPF 30+ to sun-exposed areas, reapply every 2 hours as needed. - Call for any changes  Seborrheic Keratoses - Stuck-on, waxy, tan-brown papules and/or plaques  - Benign-appearing - Discussed benign etiology and prognosis. - Observe - Call for any changes  Melanocytic Nevi - Tan-brown and/or pink-flesh-colored symmetric macules and papules - Benign appearing on exam today - Observation - Call clinic for new or changing moles - Recommend daily use of broad spectrum spf 30+ sunscreen to sun-exposed areas.   Hemangiomas - Red papules - Discussed benign nature - Observe - Call for any changes  Actinic Damage - Chronic condition, secondary to cumulative UV/sun exposure - diffuse scaly erythematous macules with underlying dyspigmentation - Recommend daily broad spectrum sunscreen SPF 30+ to sun-exposed areas, reapply every 2 hours as needed.  - Staying in the shade or wearing long sleeves, sun glasses (UVA+UVB protection) and wide brim hats (4-inch brim around the entire circumference of the hat) are also recommended for sun protection.  - Call for new or changing lesions.  Skin cancer screening performed today.  History of Basal Cell Carcinoma of the Skin - No evidence of recurrence today - Recommend regular full body skin exams - Recommend daily broad spectrum sunscreen SPF 30+ to sun-exposed areas, reapply every 2 hours as needed.  - Call if any new or changing lesions are noted between office visits  History of Dysplastic Nevi - No evidence of recurrence  today - Recommend regular full body skin exams - Recommend daily broad spectrum sunscreen SPF 30+ to sun-exposed areas, reapply every 2 hours as needed.  - Call if any new or changing lesions are noted between office visits  Return in about 6 months (around 06/09/2022) for TBSE.  Graciella Belton, RMA, am acting as scribe for Forest Gleason, MD .  Documentation: I have reviewed the above documentation for accuracy and completeness, and I agree with the above.  Forest Gleason, MD

## 2021-12-12 ENCOUNTER — Telehealth: Payer: Self-pay

## 2021-12-12 NOTE — Telephone Encounter (Signed)
-----   Message from Alfonso Patten, MD sent at 12/12/2021  1:49 PM EDT ----- Skin , low chest right of midline BASAL CELL CARCINOMA, SUPERFICIAL AND NODULAR PATTERNS, PERIPHERAL MARGIN INVOLVED --> already treated with [facility]   MAs please call. Thank you!

## 2021-12-13 ENCOUNTER — Telehealth: Payer: Self-pay

## 2021-12-13 NOTE — Telephone Encounter (Addendum)
  Called and discussed results with patient. He verbalized understanding and denied further questions.  ----- Message from Alfonso Patten, MD sent at 12/12/2021  1:49 PM EDT ----- Skin , low chest right of midline BASAL CELL CARCINOMA, SUPERFICIAL AND NODULAR PATTERNS, PERIPHERAL MARGIN INVOLVED --> already treated with [facility]   MAs please call. Thank you!

## 2021-12-14 ENCOUNTER — Encounter: Payer: Self-pay | Admitting: Internal Medicine

## 2021-12-15 ENCOUNTER — Other Ambulatory Visit: Payer: Self-pay

## 2021-12-15 MED ORDER — OMEPRAZOLE 40 MG PO CPDR: 40.0000 mg | DELAYED_RELEASE_CAPSULE | Freq: Every day | ORAL | 0 refills | Status: DC

## 2021-12-21 ENCOUNTER — Encounter: Payer: Self-pay | Admitting: Dermatology

## 2021-12-21 ENCOUNTER — Encounter: Payer: Self-pay | Admitting: Internal Medicine

## 2021-12-21 NOTE — Telephone Encounter (Signed)
I called patient to check how he was doing. He said that he was asymptomatic all except taste. He was not SOB, no fever, cough, congestion etc. I asked that he if he did develop symptoms he could call us back & also advised of mychart urgent care visits that can be done.

## 2022-03-05 ENCOUNTER — Ambulatory Visit (INDEPENDENT_AMBULATORY_CARE_PROVIDER_SITE_OTHER): Payer: 59 | Admitting: Internal Medicine

## 2022-03-05 ENCOUNTER — Encounter: Payer: Self-pay | Admitting: Internal Medicine

## 2022-03-05 VITALS — BP 116/74 | HR 74 | Temp 98.0°F | Resp 18 | Ht 72.0 in | Wt 176.6 lb

## 2022-03-05 DIAGNOSIS — E7800 Pure hypercholesterolemia, unspecified: Secondary | ICD-10-CM | POA: Diagnosis not present

## 2022-03-05 DIAGNOSIS — J4520 Mild intermittent asthma, uncomplicated: Secondary | ICD-10-CM | POA: Diagnosis not present

## 2022-03-05 DIAGNOSIS — K219 Gastro-esophageal reflux disease without esophagitis: Secondary | ICD-10-CM | POA: Diagnosis not present

## 2022-03-05 DIAGNOSIS — R319 Hematuria, unspecified: Secondary | ICD-10-CM | POA: Diagnosis not present

## 2022-03-05 DIAGNOSIS — Z125 Encounter for screening for malignant neoplasm of prostate: Secondary | ICD-10-CM | POA: Diagnosis not present

## 2022-03-05 DIAGNOSIS — Z0000 Encounter for general adult medical examination without abnormal findings: Secondary | ICD-10-CM

## 2022-03-05 LAB — CBC WITH DIFFERENTIAL/PLATELET
Basophils Absolute: 0.1 10*3/uL (ref 0.0–0.1)
Basophils Relative: 1 % (ref 0.0–3.0)
Eosinophils Absolute: 0.2 10*3/uL (ref 0.0–0.7)
Eosinophils Relative: 3.5 % (ref 0.0–5.0)
HCT: 43 % (ref 39.0–52.0)
Hemoglobin: 14.4 g/dL (ref 13.0–17.0)
Lymphocytes Relative: 25 % (ref 12.0–46.0)
Lymphs Abs: 1.5 10*3/uL (ref 0.7–4.0)
MCHC: 33.4 g/dL (ref 30.0–36.0)
MCV: 91.8 fl (ref 78.0–100.0)
Monocytes Absolute: 0.6 10*3/uL (ref 0.1–1.0)
Monocytes Relative: 9.4 % (ref 3.0–12.0)
Neutro Abs: 3.7 10*3/uL (ref 1.4–7.7)
Neutrophils Relative %: 61.1 % (ref 43.0–77.0)
Platelets: 214 10*3/uL (ref 150.0–400.0)
RBC: 4.68 Mil/uL (ref 4.22–5.81)
RDW: 13.3 % (ref 11.5–15.5)
WBC: 6 10*3/uL (ref 4.0–10.5)

## 2022-03-05 LAB — BASIC METABOLIC PANEL
BUN: 14 mg/dL (ref 6–23)
CO2: 30 mEq/L (ref 19–32)
Calcium: 9 mg/dL (ref 8.4–10.5)
Chloride: 101 mEq/L (ref 96–112)
Creatinine, Ser: 0.97 mg/dL (ref 0.40–1.50)
GFR: 83.97 mL/min (ref >60.00)
Glucose, Bld: 83 mg/dL (ref 70–99)
Potassium: 4.2 mEq/L (ref 3.5–5.1)
Sodium: 137 mEq/L (ref 135–145)

## 2022-03-05 LAB — LIPID PANEL
Cholesterol: 172 mg/dL (ref 0–200)
HDL: 43.5 mg/dL (ref >39.00)
LDL Cholesterol: 104 mg/dL — ABNORMAL HIGH (ref 0–99)
NonHDL: 128.33
Total CHOL/HDL Ratio: 4
Triglycerides: 122 mg/dL (ref 0.0–149.0)
VLDL: 24.4 mg/dL (ref 0.0–40.0)

## 2022-03-05 LAB — URINALYSIS, ROUTINE W REFLEX MICROSCOPIC
Bilirubin Urine: NEGATIVE
Ketones, ur: NEGATIVE
Leukocytes,Ua: NEGATIVE
Nitrite: NEGATIVE
Specific Gravity, Urine: 1.01 (ref 1.000–1.030)
Total Protein, Urine: NEGATIVE
Urine Glucose: NEGATIVE
Urobilinogen, UA: 0.2 (ref 0.0–1.0)
WBC, UA: NONE SEEN (ref 0–?)
pH: 6 (ref 5.0–8.0)

## 2022-03-05 LAB — HEPATIC FUNCTION PANEL
ALT: 14 U/L (ref 0–53)
AST: 14 U/L (ref 0–37)
Albumin: 4.3 g/dL (ref 3.5–5.2)
Alkaline Phosphatase: 65 U/L (ref 39–117)
Bilirubin, Direct: 0.1 mg/dL (ref 0.0–0.3)
Total Bilirubin: 0.7 mg/dL (ref 0.2–1.2)
Total Protein: 6.9 g/dL (ref 6.0–8.3)

## 2022-03-05 LAB — TSH: TSH: 0.81 u[IU]/mL (ref 0.35–5.50)

## 2022-03-05 LAB — PSA: PSA: 0.32 ng/mL (ref 0.10–4.00)

## 2022-03-05 MED ORDER — FLUTICASONE PROPIONATE 50 MCG/ACT NA SUSP: 1.0000 | Freq: Every day | NASAL | 1 refills | Status: DC

## 2022-03-05 MED ORDER — TRELEGY ELLIPTA 100-62.5-25 MCG/ACT IN AEPB: 1.0000 | INHALATION_SPRAY | Freq: Every day | RESPIRATORY_TRACT | 3 refills | Status: DC

## 2022-03-05 MED ORDER — OMEPRAZOLE 20 MG PO CPDR: 20.0000 mg | DELAYED_RELEASE_CAPSULE | Freq: Every day | ORAL | 3 refills | Status: AC

## 2022-03-05 NOTE — Assessment & Plan Note (Signed)
Physical today 03/05/22.  Colonoscopy 10/2020 - diverticulosis, internal hemorrhoids.  Recommended f/u in 10 years.  Check psa today.

## 2022-03-05 NOTE — Progress Notes (Signed)
Patient ID: Howard Berry, male   DOB: August 21, 1960, 61 y.o.   MRN: [id number]   Subjective:    Patient ID: Howard Berry, male    DOB: 08/17/1960, 61 y.o.   MRN: [id number]   Patient here for  Chief Complaint  Patient presents with   Annual Exam    CPE   .   HPI Here for his physical exam.  Covid - 12/2021.  No residual problems. History of asthma. Saw Dr Patsey Berthold 07/2021 - continue trelegy.  Singulair added. Did not feel helped.  Stopped taking.  He feels his breathing is stable.  No increased cough.  Saw ortho 03/2021 - right shoulder pain. S/p injection. No problems reported today.  No chest pain.  Stays active.  No acid reflux.  Treid '20mg'$  of omeprazole and this worked.  Discussed decreasing the dose.  No nausea or vomiting.  Bowels moving.     Past Medical History:  Diagnosis Date   Basal cell carcinoma 03/22/2021   R abdomen - ED&C   BCC (basal cell carcinoma) 12/07/2021   low chest right of midline, tx'd with EDC   Dysplastic nevus 03/22/2021   R lower back - moderate   GERD (gastroesophageal reflux disease)    EGD (2007) - erosive duodenitis, reflux esophagitis, multiple gastric polyps  required dilatation.   Hypercholesterolemia    Left axis deviation    noted on prior EKG   Recurrent sinusitis    Past Surgical History:  Procedure Laterality Date   CHOLECYSTECTOMY  10/02/12   emergency surgery   [place]   Family History  Problem Relation Age of Onset   Heart disease Father    Throat cancer Father    Lung cancer Father    Thyroid disease Mother    Hematuria Mother    Heart disease Maternal Grandmother    Heart disease Paternal Grandfather        MI (died age 6)   Colon cancer Neg Hx    Prostate cancer Neg Hx    Social History   Socioeconomic History   Marital status: Married    Spouse name: Not on file   Number of children: Not on file   Years of education: Not on file   Highest education level: Not on file  Occupational  History   Not on file  Tobacco Use   Smoking status: Never   Smokeless tobacco: Current    Types: Chew  Vaping Use   Vaping Use: Never used  Substance and Sexual Activity   Alcohol use: No    Alcohol/week: 0.0 standard drinks of alcohol   Drug use: No   Sexual activity: Not on file  Other Topics Concern   Not on file  Social History Narrative   Not on file   Social Determinants of Health   Financial Resource Strain: Not on file  Food Insecurity: Not on file  Transportation Needs: Not on file  Physical Activity: Not on file  Stress: Not on file  Social Connections: Not on file     Review of Systems  Constitutional:  Negative for appetite change and unexpected weight change.  HENT:  Negative for congestion, sinus pressure and sore throat.   Eyes:  Negative for pain and visual disturbance.  Respiratory:  Negative for cough, chest tightness and shortness of breath.        Some wheezing initially.  Cleared when I had him cough.  Increased air movement.  Cardiovascular:  Negative for chest pain and palpitations.  Gastrointestinal:  Negative for abdominal pain, diarrhea, nausea and vomiting.  Genitourinary:  Negative for difficulty urinating and dysuria.  Musculoskeletal:  Negative for joint swelling and myalgias.  Skin:  Negative for color change and rash.  Neurological:  Negative for dizziness, light-headedness and headaches.  Hematological:  Negative for adenopathy. Does not bruise/bleed easily.  Psychiatric/Behavioral:  Negative for agitation and dysphoric mood.        Objective:     BP 116/74 (BP Location: Left Arm, Patient Position: Sitting, Cuff Size: Small)   Pulse 74   Temp 98 F (36.7 C) (Temporal)   Resp 18   Ht 6' (1.829 m)   Wt 176 lb 9.6 oz (80.1 kg)   SpO2 96%   BMI 23.95 kg/m  Wt Readings from Last 3 Encounters:  03/05/22 176 lb 9.6 oz (80.1 kg)  07/11/21 174 lb (78.9 kg)  03/02/21 175 lb 6.4 oz (79.6 kg)    Physical Exam Constitutional:       General: He is not in acute distress.    Appearance: Normal appearance. He is well-developed.  HENT:     Head: Normocephalic and atraumatic.     Right Ear: External ear normal.     Left Ear: External ear normal.  Eyes:     General: No scleral icterus.       Right eye: No discharge.        Left eye: No discharge.     Conjunctiva/sclera: Conjunctivae normal.  Neck:     Thyroid: No thyromegaly.  Cardiovascular:     Rate and Rhythm: Normal rate and regular rhythm.  Pulmonary:     Effort: No respiratory distress.     Breath sounds: Normal breath sounds. No wheezing.  Abdominal:     General: Bowel sounds are normal.     Palpations: Abdomen is soft.     Tenderness: There is no abdominal tenderness.  Musculoskeletal:        General: No swelling or tenderness.     Cervical back: Neck supple. No tenderness.  Lymphadenopathy:     Cervical: No cervical adenopathy.  Skin:    Findings: No erythema or rash.  Neurological:     Mental Status: He is alert and oriented to person, place, and time.  Psychiatric:        Mood and Affect: Mood normal.        Behavior: Behavior normal.      Outpatient Encounter Medications as of 03/05/2022  Medication Sig   omeprazole (PRILOSEC) 20 MG capsule Take 1 capsule (20 mg total) by mouth daily.   [DISCONTINUED] fluticasone (FLONASE) 50 MCG/ACT nasal spray Place 1 spray into both nostrils daily. PRN   [DISCONTINUED] Fluticasone-Umeclidin-Vilant (TRELEGY ELLIPTA) 100-62.5-25 MCG/ACT AEPB Inhale 1 puff into the lungs daily.   [DISCONTINUED] omeprazole (PRILOSEC) 40 MG capsule Take 1 capsule (40 mg total) by mouth daily.   fluticasone (FLONASE) 50 MCG/ACT nasal spray Place 1 spray into both nostrils daily. PRN   Fluticasone-Umeclidin-Vilant (TRELEGY ELLIPTA) 100-62.5-25 MCG/ACT AEPB Inhale 1 puff into the lungs daily.   [DISCONTINUED] albuterol (VENTOLIN HFA) 108 (90 Base) MCG/ACT inhaler Inhale 2 puffs into the lungs every 6 (six) hours as needed for  wheezing or shortness of breath.   [DISCONTINUED] montelukast (SINGULAIR) 10 MG tablet Take 1 tablet (10 mg total) by mouth at bedtime.   No facility-administered encounter medications on file as of 03/05/2022.     Lab Results  Component Value Date  WBC 6.0 03/05/2022   HGB 14.4 03/05/2022   HCT 43.0 03/05/2022   PLT 214.0 03/05/2022   GLUCOSE 83 03/05/2022   CHOL 172 03/05/2022   TRIG 122.0 03/05/2022   HDL 43.50 03/05/2022   LDLDIRECT 89.0 07/11/2016   LDLCALC 104 (H) 03/05/2022   ALT 14 03/05/2022   AST 14 03/05/2022   NA 137 03/05/2022   K 4.2 03/05/2022   CL 101 03/05/2022   CREATININE 0.97 03/05/2022   BUN 14 03/05/2022   CO2 30 03/05/2022   TSH 0.81 03/05/2022   PSA 0.32 03/05/2022       Assessment & Plan:   Problem List Items Addressed This Visit     Asthma    Has known mild asthma as outlined.  Has done well on Trelegy.  Breathing stable.  Follow-up. He stopped singulair as outlined.       Relevant Medications   Fluticasone-Umeclidin-Vilant (TRELEGY ELLIPTA) 100-62.5-25 MCG/ACT AEPB   GERD (gastroesophageal reflux disease)    On omeprazole '20mg'$ .  Doing well on this dose.  Follow.       Relevant Medications   omeprazole (PRILOSEC) 20 MG capsule   Health care maintenance    Physical today 03/05/22.  Colonoscopy 10/2020 - diverticulosis, internal hemorrhoids.  Recommended f/u in 10 years.  Check psa today.       Hematuria    Recheck urine to confirm clear.       Relevant Orders   Urinalysis, Routine w reflex microscopic (Completed)   Hypercholesterolemia    Low cholesterol diet and exercise.  Follow lipid panel.       Relevant Orders   Lipid panel (Completed)   TSH (Completed)   Hepatic function panel (Completed)   CBC with Differential/Platelet (Completed)   Basic metabolic panel (Completed)   Other Visit Diagnoses     Routine general medical examination at a health care facility    -  Primary   Prostate cancer screening       Relevant  Orders   PSA (Completed)        Einar Pheasant, MD

## 2022-03-07 ENCOUNTER — Telehealth: Payer: Self-pay

## 2022-03-07 NOTE — Telephone Encounter (Signed)
Patient states he is returning call from Denita [facility], [place].  Patient states if she needs to call him tomorrow, please call him after 3:30pm.

## 2022-03-11 ENCOUNTER — Encounter: Payer: Self-pay | Admitting: Internal Medicine

## 2022-03-11 NOTE — Assessment & Plan Note (Signed)
Low cholesterol diet and exercise.  Follow lipid panel.   

## 2022-03-11 NOTE — Assessment & Plan Note (Addendum)
Has known mild asthma as outlined.  Has done well on Trelegy.  Breathing stable.  Follow-up. He stopped singulair as outlined.

## 2022-03-11 NOTE — Assessment & Plan Note (Signed)
Recheck urine to confirm clear.

## 2022-03-11 NOTE — Assessment & Plan Note (Signed)
On omeprazole '20mg'$ .  Doing well on this dose.  Follow.

## 2022-04-02 ENCOUNTER — Encounter: Payer: Self-pay | Admitting: Internal Medicine

## 2022-04-04 ENCOUNTER — Ambulatory Visit: Payer: 59 | Admitting: Family Medicine

## 2022-04-05 ENCOUNTER — Encounter: Payer: Self-pay | Admitting: Family Medicine

## 2022-04-05 ENCOUNTER — Ambulatory Visit: Payer: 59 | Admitting: Family Medicine

## 2022-04-05 VITALS — BP 110/80 | HR 81 | Temp 98.4°F | Ht 72.0 in | Wt 176.6 lb

## 2022-04-05 DIAGNOSIS — R0981 Nasal congestion: Secondary | ICD-10-CM

## 2022-04-05 NOTE — Progress Notes (Signed)
SUBJECTIVE:   Chief Complaint  Patient presents with   Acute Visit    Sinus pain/pressure X 5 days   HPI Patient presents to clinic for sinus issues that have now resolved.  Symptoms started 5 nights ago.  Worsened over the next 2 days.  Had green and yellow nasal discharge.  Reports left-sided nasal congestion with sinus pressure.  Endorses last night was able to breathe more and today feeling better.  Denies any headaches, fevers, cough, ear pain, difficulty swallowing or decrease in appetite.  History of recurrent sinusitis.  Was previously seen by pulmonology who added Singulair to help it with sinuses, patient stopped medication as he felt it did not help.  Takes fluticasone nasal spray, trilogy Ellipta.     PERTINENT PMH / PSH: Recurrent sinus congestion Asthma   OBJECTIVE:  BP 110/80   Pulse 81   Temp 98.4 F (36.9 C) (Oral)   Ht 6' (1.829 m)   Wt 176 lb 9.6 oz (80.1 kg)   SpO2 96%   BMI 23.95 kg/m    Physical Exam Vitals reviewed.  Constitutional:      General: He is not in acute distress.    Appearance: Normal appearance. He is not ill-appearing, toxic-appearing or diaphoretic.  HENT:     Head: Normocephalic.     Right Ear: Tympanic membrane, ear canal and external ear normal. There is no impacted cerumen.     Left Ear: Tympanic membrane, ear canal and external ear normal. There is no impacted cerumen.     Nose: No nasal deformity, septal deviation, nasal tenderness, mucosal edema, congestion or rhinorrhea.     Right Nostril: No septal hematoma.     Left Nostril: No septal hematoma.     Right Turbinates: Pale. Not swollen.     Left Turbinates: Pale. Not swollen.     Right Sinus: No maxillary sinus tenderness or frontal sinus tenderness.     Left Sinus: No maxillary sinus tenderness or frontal sinus tenderness.     Mouth/Throat:     Mouth: Mucous membranes are moist.     Pharynx: Oropharynx is clear. No oropharyngeal exudate or posterior oropharyngeal  erythema.  Eyes:     General:        Right eye: No discharge.        Left eye: No discharge.     Conjunctiva/sclera: Conjunctivae normal.  Cardiovascular:     Rate and Rhythm: Normal rate and regular rhythm.     Heart sounds: Normal heart sounds.  Pulmonary:     Effort: Pulmonary effort is normal.     Breath sounds: Normal breath sounds.  Musculoskeletal:        General: Normal range of motion.  Lymphadenopathy:     Cervical: No cervical adenopathy.  Skin:    General: Skin is warm and dry.  Neurological:     General: No focal deficit present.     Mental Status: He is alert and oriented to person, place, and time. Mental status is at baseline.  Psychiatric:        Mood and Affect: Mood normal.        Behavior: Behavior normal.        Thought Content: Thought content normal.        Judgment: Judgment normal.     ASSESSMENT/PLAN:  Sinus congestion Assessment & Plan: Symptoms now improved.  Suspect multifactorial, environmental versus viral etiology.  Could not find any previous imaging to confirm chronic sinusitis.  Patient reports has  seen ENT previously and plan was for sinus surgery but given COVID has been postponed.  He is now looking for ENT provider in network at [facility]. -Continue Flonase 2 sprays twice daily -Humidifier at night -Reduce allergens -Recommend follow-up with ENT, if needing referral patient will notify PCP    PDMP reviewed  Return if symptoms worsen or fail to improve.  Carollee Leitz, MD

## 2022-04-05 NOTE — Patient Instructions (Signed)
It was a pleasure meeting you today. Thank you for allowing me to take part in your health care.  Our goals for today as we discussed include:  Glad you are feeling a little better.  I think this is likely a viral infection Continue Flonase daily  Recommend finding ENT doctor. If you need referral please let me know.  If any worsening symptoms notify MD and will call in prescription for antibiotics.   If you have any questions or concerns, please do not hesitate to call the office at [phone].  I look forward to our next visit and until then take care and stay safe.  Regards,   Carollee Leitz, MD   [facility]

## 2022-04-21 ENCOUNTER — Encounter: Payer: Self-pay | Admitting: Family Medicine

## 2022-04-21 NOTE — Assessment & Plan Note (Addendum)
Symptoms now improved.  Suspect multifactorial, environmental versus viral etiology.  Could not find any previous imaging to confirm chronic sinusitis.  Patient reports has seen ENT previously and plan was for sinus surgery but given COVID has been postponed.  He is now looking for ENT provider in network at [facility]. -Continue Flonase 2 sprays twice daily -Humidifier at night -Reduce allergens -Recommend follow-up with ENT, if needing referral patient will notify PCP

## 2022-06-15 ENCOUNTER — Other Ambulatory Visit: Payer: Self-pay | Admitting: Internal Medicine

## 2022-06-20 ENCOUNTER — Ambulatory Visit (INDEPENDENT_AMBULATORY_CARE_PROVIDER_SITE_OTHER): Payer: 59 | Admitting: Dermatology

## 2022-06-20 VITALS — BP 131/73

## 2022-06-20 DIAGNOSIS — L578 Other skin changes due to chronic exposure to nonionizing radiation: Secondary | ICD-10-CM

## 2022-06-20 DIAGNOSIS — D225 Melanocytic nevi of trunk: Secondary | ICD-10-CM

## 2022-06-20 DIAGNOSIS — L814 Other melanin hyperpigmentation: Secondary | ICD-10-CM

## 2022-06-20 DIAGNOSIS — L570 Actinic keratosis: Secondary | ICD-10-CM

## 2022-06-20 DIAGNOSIS — L738 Other specified follicular disorders: Secondary | ICD-10-CM

## 2022-06-20 DIAGNOSIS — D229 Melanocytic nevi, unspecified: Secondary | ICD-10-CM

## 2022-06-20 DIAGNOSIS — Z85828 Personal history of other malignant neoplasm of skin: Secondary | ICD-10-CM

## 2022-06-20 NOTE — Progress Notes (Signed)
   Follow-Up Visit   Subjective  Howard Berry is a 62 y.o. male who presents for the following: Follow-up (Biopsy proven BCC (Dr Laurence Ferrari) of low chest right of midline - treated with EDC- declines TBSE today).  Recheck BCC site today.  H/o Aks face.    The following portions of the chart were reviewed this encounter and updated as appropriate:       Review of Systems:  No other skin or systemic complaints except as noted in HPI or Assessment and Plan.  Objective  Well appearing patient in no apparent distress; mood and affect are within normal limits.  All skin waist up examined.  Low chest right of midline Well healed EDC site  Mid forehead (6) Erythematous thin papules/macules with gritty scale.   Glabella Yellow papules  Low back Flesh colored papule    Assessment & Plan   Actinic Damage - chronic, secondary to cumulative UV radiation exposure/sun exposure over time - diffuse scaly erythematous macules with underlying dyspigmentation - Recommend daily broad spectrum sunscreen SPF 30+ to sun-exposed areas, reapply every 2 hours as needed.  - Recommend staying in the shade or wearing long sleeves, sun glasses (UVA+UVB protection) and wide brim hats (4-inch brim around the entire circumference of the hat). - Call for new or changing lesions.  Lentigines - Scattered tan macules - Due to sun exposure - Benign-appearing, observe - Recommend daily broad spectrum sunscreen SPF 30+ to sun-exposed areas, reapply every 2 hours as needed. - Call for any changes  History of basal cell carcinoma (BCC) Low chest right of midline  Clear. Observe for recurrence. Call clinic for new or changing lesions.  Recommend regular skin exams, daily broad-spectrum spf 30+ sunscreen use, and photoprotection.     AK (actinic keratosis) (6) Mid forehead  Actinic keratoses are precancerous spots that appear secondary to cumulative UV radiation exposure/sun exposure over time. They are  chronic with expected duration over 1 year. A portion of actinic keratoses will progress to squamous cell carcinoma of the skin. It is not possible to reliably predict which spots will progress to skin cancer and so treatment is recommended to prevent development of skin cancer.  Recommend daily broad spectrum sunscreen SPF 30+ to sun-exposed areas, reapply every 2 hours as needed.  Recommend staying in the shade or wearing long sleeves, sun glasses (UVA+UVB protection) and wide brim hats (4-inch brim around the entire circumference of the hat). Call for new or changing lesions.   Destruction of lesion - Mid forehead  Destruction method: cryotherapy   Informed consent: discussed and consent obtained   Timeout:  patient name, date of birth, surgical site, and procedure verified Lesion destroyed using liquid nitrogen: Yes   Region frozen until ice ball extended beyond lesion: Yes   Outcome: patient tolerated procedure well with no complications   Post-procedure details: wound care instructions given    Sebaceous hyperplasia Glabella  Benign, observe.    Nevus Low back  Benign-appearing.  Observation.  Call clinic for new or changing lesions.  Recommend daily use of broad spectrum spf 30+ sunscreen to sun-exposed areas.     Return in about 6 months (around 12/19/2022) for Follow up UBSE.  I, Ashok Cordia, CMA, am acting as scribe for Brendolyn Patty, MD .  Documentation: I have reviewed the above documentation for accuracy and completeness, and I agree with the above.  Brendolyn Patty MD

## 2022-06-20 NOTE — Patient Instructions (Signed)
Cryotherapy Aftercare  Wash gently with soap and water everyday.   Apply Vaseline and Band-Aid daily until healed.     Due to recent changes in healthcare laws, you may see results of your pathology and/or laboratory studies on MyChart before the doctors have had a chance to review them. We understand that in some cases there may be results that are confusing or concerning to you. Please understand that not all results are received at the same time and often the doctors may need to interpret multiple results in order to provide you with the best plan of care or course of treatment. Therefore, we ask that you please give us 2 business days to thoroughly review all your results before contacting the office for clarification. Should we see a critical lab result, you will be contacted sooner.   If You Need Anything After Your Visit  If you have any questions or concerns for your doctor, please call our main line at [phone] and press option 4 to reach your doctor's medical assistant. If no one answers, please leave a voicemail as directed and we will return your call as soon as possible. Messages left after 4 pm will be answered the following business day.   You may also send us a message via MyChart. We typically respond to MyChart messages within 1-2 business days.  For prescription refills, please ask your pharmacy to contact our office. Our fax number is [phone].  If you have an urgent issue when the clinic is closed that cannot wait until the next business day, you can page your doctor at the number below.    Please note that while we do our best to be available for urgent issues outside of office hours, we are not available 24/7.   If you have an urgent issue and are unable to reach us, you may choose to seek medical care at your doctor's office, retail clinic, urgent care center, or emergency room.  If you have a medical emergency, please immediately call 911 or go to the  emergency department.  Pager Numbers  - Dr. Kowalski: [phone]  - Dr. Moye: [phone]  - Dr. Stewart: [phone]  In the event of inclement weather, please call our main line at [phone] for an update on the status of any delays or closures.  Dermatology Medication Tips: Please keep the boxes that topical medications come in in order to help keep track of the instructions about where and how to use these. Pharmacies typically print the medication instructions only on the boxes and not directly on the medication tubes.   If your medication is too expensive, please contact our office at [phone] option 4 or send us a message through MyChart.   We are unable to tell what your co-pay for medications will be in advance as this is different depending on your insurance coverage. However, we may be able to find a substitute medication at lower cost or fill out paperwork to get insurance to cover a needed medication.   If a prior authorization is required to get your medication covered by your insurance company, please allow us 1-2 business days to complete this process.  Drug prices often vary depending on where the prescription is filled and some pharmacies may offer cheaper prices.  The website www.goodrx.com contains coupons for medications through different pharmacies. The prices here do not account for what the cost may be with help from insurance (it may be cheaper with your insurance), but the website can   give you the price if you did not use any insurance.  - You can print the associated coupon and take it with your prescription to the pharmacy.  - You may also stop by our office during regular business hours and pick up a GoodRx coupon card.  - If you need your prescription sent electronically to a different pharmacy, notify our office through [place] MyChart or by phone at [phone] option 4.     Si Usted Necesita Algo Despus de Su Visita  Tambin puede  enviarnos un mensaje a travs de MyChart. Por lo general respondemos a los mensajes de MyChart en el transcurso de 1 a 2 das hbiles.  Para renovar recetas, por favor pida a su farmacia que se ponga en contacto con nuestra oficina. Nuestro nmero de fax es el [phone].  Si tiene un asunto urgente cuando la clnica est cerrada y que no puede esperar hasta el siguiente da hbil, puede llamar/localizar a su doctor(a) al nmero que aparece a continuacin.   Por favor, tenga en cuenta que aunque hacemos todo lo posible para estar disponibles para asuntos urgentes fuera del horario de oficina, no estamos disponibles las 24 horas del da, los 7 das de la semana.   Si tiene un problema urgente y no puede comunicarse con nosotros, puede optar por buscar atencin mdica  en el consultorio de su doctor(a), en una clnica privada, en un centro de atencin urgente o en una sala de emergencias.  Si tiene una emergencia mdica, por favor llame inmediatamente al 911 o vaya a la sala de emergencias.  Nmeros de bper  - Dr. Kowalski: [phone]  - Dra. Moye: [phone]  - Dra. Stewart: [phone]  En caso de inclemencias del tiempo, por favor llame a nuestra lnea principal al [phone] para una actualizacin sobre el estado de cualquier retraso o cierre.  Consejos para la medicacin en dermatologa: Por favor, guarde las cajas en las que vienen los medicamentos de uso tpico para ayudarle a seguir las instrucciones sobre dnde y cmo usarlos. Las farmacias generalmente imprimen las instrucciones del medicamento slo en las cajas y no directamente en los tubos del medicamento.   Si su medicamento es muy caro, por favor, pngase en contacto con nuestra oficina llamando al [phone] y presione la opcin 4 o envenos un mensaje a travs de MyChart.   No podemos decirle cul ser su copago por los medicamentos por adelantado ya que esto es diferente dependiendo de la cobertura de su seguro.  Sin embargo, es posible que podamos encontrar un medicamento sustituto a menor costo o llenar un formulario para que el seguro cubra el medicamento que se considera necesario.   Si se requiere una autorizacin previa para que su compaa de seguros cubra su medicamento, por favor permtanos de 1 a 2 das hbiles para completar este proceso.  Los precios de los medicamentos varan con frecuencia dependiendo del lugar de dnde se surte la receta y alguna farmacias pueden ofrecer precios ms baratos.  El sitio web www.goodrx.com tiene cupones para medicamentos de diferentes farmacias. Los precios aqu no tienen en cuenta lo que podra costar con la ayuda del seguro (puede ser ms barato con su seguro), pero el sitio web puede darle el precio si no utiliz ningn seguro.  - Puede imprimir el cupn correspondiente y llevarlo con su receta a la farmacia.  - Tambin puede pasar por nuestra oficina durante el horario de atencin regular y recoger una tarjeta de cupones de GoodRx.  -   Si necesita que su receta se enve electrnicamente a una farmacia diferente, informe a nuestra oficina a travs de MyChart de [place] o por telfono llamando al [phone] y presione la opcin 4.  

## 2022-06-21 ENCOUNTER — Ambulatory Visit: Payer: 59 | Admitting: Dermatology

## 2022-07-09 ENCOUNTER — Encounter: Payer: Self-pay | Admitting: Internal Medicine

## 2022-07-09 NOTE — Progress Notes (Unsigned)
MyChart Video Visit    Virtual Visit via Video Note   This visit type was conducted because this format is felt to be most appropriate for this patient at this time. Physical exam was limited by quality of the video and audio technology used for the visit. CMA was able to get the patient set up on a video visit.  Patient location: Home. Patient and provider in visit Provider location: Office  I discussed the limitations of evaluation and management by telemedicine and the availability of in person appointments. The patient expressed understanding and agreed to proceed.  Visit Date: 07/10/2022  Today's healthcare provider: Tomasita Morrow, NP     Subjective:    Patient ID: Howard Berry, male    DOB: July 15, 1960, 62 y.o.   MRN: [id number]  No chief complaint on file.   HPI  Respiratory illness:  Cough- ***  Congestion- ***   Sinus- ***   Chest- ***  Post nasal drip- ***  Sore throat- ***  Shortness of breath- ***  Fever- ***  Fatigue/Myalgia- *** Headache- *** Nausea/Vomiting- *** Taste disturbance- ***  Smell disturbance- ***  Covid exposure- ***  Covid vaccination- ***  Flu vaccination- ***  Medications- ***  Past Medical History:  Diagnosis Date   Basal cell carcinoma 03/22/2021   R abdomen - ED&C   BCC (basal cell carcinoma) 12/07/2021   low chest right of midline, tx'd with EDC   Dysplastic nevus 03/22/2021   R lower back - moderate   GERD (gastroesophageal reflux disease)    EGD (2007) - erosive duodenitis, reflux esophagitis, multiple gastric polyps  required dilatation.   Hypercholesterolemia    Left axis deviation    noted on prior EKG   Recurrent sinusitis     Past Surgical History:  Procedure Laterality Date   CHOLECYSTECTOMY  10/02/12   emergency surgery   [facility]    Family History  Problem Relation Age of Onset   Heart disease Father    Throat cancer Father    Lung cancer Father    Thyroid disease  Mother    Hematuria Mother    Heart disease Maternal Grandmother    Heart disease Paternal Grandfather        MI (died age 4)   Colon cancer Neg Hx    Prostate cancer Neg Hx     Social History   Socioeconomic History   Marital status: Married    Spouse name: Not on file   Number of children: Not on file   Years of education: Not on file   Highest education level: Not on file  Occupational History   Not on file  Tobacco Use   Smoking status: Never   Smokeless tobacco: Current    Types: Chew  Vaping Use   Vaping Use: Never used  Substance and Sexual Activity   Alcohol use: No    Alcohol/week: 0.0 standard drinks of alcohol   Drug use: No   Sexual activity: Not on file  Other Topics Concern   Not on file  Social History Narrative   Not on file   Social Determinants of Health   Financial Resource Strain: Not on file  Food Insecurity: Not on file  Transportation Needs: Not on file  Physical Activity: Not on file  Stress: Not on file  Social Connections: Not on file  Intimate Partner Violence: Not on file    Outpatient Medications Prior to Visit  Medication  Sig Dispense Refill   fluticasone (FLONASE) 50 MCG/ACT nasal spray USE 1 SPRAY INTO BOTH NOSTRILS ONCE DAILY AS NEEDED 16 g 0   Fluticasone-Umeclidin-Vilant (TRELEGY ELLIPTA) 100-62.5-25 MCG/ACT AEPB Inhale 1 puff into the lungs daily. 3 each 3   omeprazole (PRILOSEC) 20 MG capsule Take 1 capsule (20 mg total) by mouth daily. 90 capsule 3   No facility-administered medications prior to visit.    Allergies  Allergen Reactions   Augmentin [Amoxicillin-Pot Clavulanate]    Chlor-Trimeton [Chlorpheniramine Maleate]    Penicillins     ROS     Objective:    Physical Exam  There were no vitals taken for this visit. Wt Readings from Last 3 Encounters:  04/05/22 176 lb 9.6 oz (80.1 kg)  03/05/22 176 lb 9.6 oz (80.1 kg)  07/11/21 174 lb (78.9 kg)   GENERAL: alert, oriented, appears well and in no acute  distress   HEENT: atraumatic, conjunttiva clear, no obvious abnormalities on inspection of external nose and ears   NECK: normal movements of the head and neck   LUNGS: on inspection no signs of respiratory distress, breathing rate appears normal, no obvious gross SOB, gasping or wheezing   CV: no obvious cyanosis   MS: moves all visible extremities without noticeable abnormality   PSYCH/NEURO: pleasant and cooperative, no obvious depression or anxiety, speech and thought processing grossly intact     Assessment & Plan:   Problem List Items Addressed This Visit   None   I am having Samil A. Buehrle maintain his Trelegy Ellipta, omeprazole, and fluticasone.  No orders of the defined types were placed in this encounter.   I discussed the assessment and treatment plan with the patient. The patient was provided an opportunity to ask questions and all were answered. The patient agreed with the plan and demonstrated an understanding of the instructions.   The patient was advised to call back or seek an in-person evaluation if the symptoms worsen or if the condition fails to improve as anticipated.   Tomasita Morrow, NP [place] at [facility] [phone] (phone) [phone] (fax)  [place]

## 2022-07-10 ENCOUNTER — Telehealth: Payer: Self-pay

## 2022-07-10 ENCOUNTER — Telehealth: Payer: 59 | Admitting: Family

## 2022-07-10 ENCOUNTER — Encounter: Payer: Self-pay | Admitting: Nurse Practitioner

## 2022-07-10 ENCOUNTER — Telehealth (INDEPENDENT_AMBULATORY_CARE_PROVIDER_SITE_OTHER): Payer: 59 | Admitting: Nurse Practitioner

## 2022-07-10 VITALS — Ht 72.0 in | Wt 176.0 lb

## 2022-07-10 DIAGNOSIS — J069 Acute upper respiratory infection, unspecified: Secondary | ICD-10-CM | POA: Diagnosis not present

## 2022-07-10 MED ORDER — AZITHROMYCIN 250 MG PO TABS: ORAL_TABLET | ORAL | 0 refills | Status: AC

## 2022-07-10 MED ORDER — METHYLPREDNISOLONE 4 MG PO TBPK: ORAL_TABLET | ORAL | 0 refills | Status: AC

## 2022-07-10 MED ORDER — BENZONATATE 200 MG PO CAPS: 200.0000 mg | ORAL_CAPSULE | Freq: Three times a day (TID) | ORAL | 0 refills | Status: AC | PRN

## 2022-07-10 NOTE — Telephone Encounter (Signed)
Called to speak with pt to see if he was able to get his tele health started early, pt stated he was still out and would not be home until 345 but as soon as he got home he would log on.

## 2022-07-10 NOTE — Assessment & Plan Note (Signed)
Will treat with Zpak, MDP and Benzonatate. Encouraged adequate fluid intake. Advised patient to continue using inhaler daily. Return precautions given to patient, seek in person care if not improving or worsening.

## 2022-07-11 NOTE — Addendum Note (Signed)
Addended by: Tomasita Morrow on: 07/11/2022 10:47 AM   Modules accepted: Level of Service

## 2022-07-15 ENCOUNTER — Encounter: Payer: Self-pay | Admitting: Internal Medicine

## 2022-07-16 ENCOUNTER — Other Ambulatory Visit: Payer: Self-pay

## 2022-07-16 MED ORDER — TRELEGY ELLIPTA 100-62.5-25 MCG/ACT IN AEPB: 1.0000 | INHALATION_SPRAY | Freq: Every day | RESPIRATORY_TRACT | 3 refills | Status: AC

## 2022-09-04 ENCOUNTER — Ambulatory Visit: Payer: 59 | Admitting: Internal Medicine

## 2023-01-15 ENCOUNTER — Ambulatory Visit: Payer: 59 | Admitting: Dermatology

## 2023-02-15 ENCOUNTER — Other Ambulatory Visit: Payer: Self-pay | Admitting: Internal Medicine

## 2023-02-15 NOTE — Telephone Encounter (Signed)
LVM for pt to CB and get scheduled pt LOV with PCP was 03-05-22

## 2023-02-18 NOTE — Telephone Encounter (Signed)
2nd detailed Vm left informing pt to book an appt for further refills

## 2023-03-18 ENCOUNTER — Other Ambulatory Visit: Payer: Self-pay | Admitting: Internal Medicine

## 2023-03-19 NOTE — Telephone Encounter (Signed)
Please refuse. Per chart review, patient has established with Dr Sampson Goon at [place].

## 2024-02-11 NOTE — Progress Notes (Signed)
    Assessment & Plan:  1. Moderate persistent asthma without complication (Primary) - Allergen Panel (27) + IGE; Future - CBC w/Diff; Future   Patient Instructions  We are going to check for allergies and your blood  Continue Trelegy for now   We will see you in follow-up in 2 to 3 months time call sooner should any new difficulties arise  Please note: late entry documentation due to logistical difficulties during COVID-19 pandemic. This note is filed for information purposes only, and is not intended to be used for billing, nor does it represent the full scope/nature of the visit in question. Please see any associated scanned media linked to date of encounter for additional pertinent information.  Subjective:    HPI: NORFLEET CAPERS is a 63 y.o. male presenting to the pulmonology clinic on 12/10/2019 with report of: Follow-up (PFT 11/19/2019- prod cough with yellow to white mucus mainly in the morning. )     Outpatient Encounter Medications as of 12/10/2019  Medication Sig Note   [DISCONTINUED] albuterol  (VENTOLIN  HFA) 108 (90 Base) MCG/ACT inhaler Inhale 2 puffs into the lungs every 6 (six) hours as needed for wheezing or shortness of breath. 08/18/2020: Pt uses prn   [DISCONTINUED] omeprazole  (PRILOSEC) 40 MG capsule TAKE 1 CAPSULE DAILY    [DISCONTINUED] TRELEGY ELLIPTA  100-62.5-25 MCG/INH AEPB USE 1 INHALATION DAILY (NEED APPOINTMENT FOR FURTHER REFILLS)    No facility-administered encounter medications on file as of 12/10/2019.      Objective:   Vitals:   12/10/19 1607  BP: 128/74  Pulse: 75  Temp: 98.4 F (36.9 C)  Height: 6' (1.829 m)  Weight: 175 lb 6.4 oz (79.6 kg)  SpO2: 95%  TempSrc: Temporal  BMI (Calculated): 23.78     Physical exam documentation is limited by delayed entry of information.

## 2024-04-03 ENCOUNTER — Other Ambulatory Visit: Payer: Self-pay | Admitting: Infectious Diseases

## 2024-04-03 DIAGNOSIS — K219 Gastro-esophageal reflux disease without esophagitis: Secondary | ICD-10-CM

## 2024-04-03 DIAGNOSIS — R062 Wheezing: Secondary | ICD-10-CM

## 2024-04-03 DIAGNOSIS — E7800 Pure hypercholesterolemia, unspecified: Secondary | ICD-10-CM

## 2024-04-03 DIAGNOSIS — Z1331 Encounter for screening for depression: Secondary | ICD-10-CM

## 2024-04-03 DIAGNOSIS — R7303 Prediabetes: Secondary | ICD-10-CM

## 2024-04-03 DIAGNOSIS — Z125 Encounter for screening for malignant neoplasm of prostate: Secondary | ICD-10-CM

## 2024-04-03 DIAGNOSIS — Z0000 Encounter for general adult medical examination without abnormal findings: Secondary | ICD-10-CM

## 2024-06-05 ENCOUNTER — Other Ambulatory Visit: Payer: Self-pay | Admitting: Infectious Diseases

## 2024-06-05 DIAGNOSIS — Z0000 Encounter for general adult medical examination without abnormal findings: Secondary | ICD-10-CM

## 2024-06-05 DIAGNOSIS — Z125 Encounter for screening for malignant neoplasm of prostate: Secondary | ICD-10-CM

## 2024-06-05 DIAGNOSIS — R062 Wheezing: Secondary | ICD-10-CM

## 2024-06-05 DIAGNOSIS — R7303 Prediabetes: Secondary | ICD-10-CM

## 2024-06-05 DIAGNOSIS — Z1331 Encounter for screening for depression: Secondary | ICD-10-CM

## 2024-06-05 DIAGNOSIS — E7800 Pure hypercholesterolemia, unspecified: Secondary | ICD-10-CM

## 2024-06-05 DIAGNOSIS — K219 Gastro-esophageal reflux disease without esophagitis: Secondary | ICD-10-CM

## 2024-07-13 ENCOUNTER — Other Ambulatory Visit
